# Patient Record
Sex: Male | Born: 1960
Health system: Southern US, Community
[De-identification: ages and names within clinical notes are randomized; demographics above are authoritative.]

## PROBLEM LIST (undated history)

## (undated) DIAGNOSIS — S066X9A Traumatic subarachnoid hemorrhage with loss of consciousness of unspecified duration, initial encounter: Secondary | ICD-10-CM

## (undated) DIAGNOSIS — E785 Hyperlipidemia, unspecified: Secondary | ICD-10-CM

## (undated) DIAGNOSIS — R4020 Unspecified coma: Secondary | ICD-10-CM

## (undated) DIAGNOSIS — Z789 Other specified health status: Secondary | ICD-10-CM

## (undated) DIAGNOSIS — K828 Other specified diseases of gallbladder: Secondary | ICD-10-CM

## (undated) DIAGNOSIS — I609 Nontraumatic subarachnoid hemorrhage, unspecified: Secondary | ICD-10-CM

## (undated) HISTORY — DX: Other specified diseases of gallbladder: K82.8

## (undated) HISTORY — DX: Hyperlipidemia, unspecified: E78.5

## (undated) HISTORY — PX: CARPAL TUNNEL RELEASE: SHX101

---

## 1981-01-13 HISTORY — PX: CHEST SURGERY: SHX595

## 1990-01-13 HISTORY — PX: VARICOCELECTOMY: SHX1084

## 1997-01-13 HISTORY — PX: VASECTOMY: SHX75

## 1998-12-04 ENCOUNTER — Ambulatory Visit (HOSPITAL_COMMUNITY): Admission: RE | Admit: 1998-12-04 | Discharge: 1998-12-04 | Payer: Self-pay | Admitting: Neurosurgery

## 1998-12-04 ENCOUNTER — Encounter: Payer: Self-pay | Admitting: Neurosurgery

## 2000-01-03 ENCOUNTER — Encounter: Payer: Self-pay | Admitting: Emergency Medicine

## 2000-01-03 ENCOUNTER — Emergency Department (HOSPITAL_COMMUNITY): Admission: EM | Admit: 2000-01-03 | Discharge: 2000-01-03 | Payer: Self-pay | Admitting: Emergency Medicine

## 2003-03-21 ENCOUNTER — Emergency Department (HOSPITAL_COMMUNITY): Admission: EM | Admit: 2003-03-21 | Discharge: 2003-03-21 | Payer: Self-pay | Admitting: Emergency Medicine

## 2004-01-14 HISTORY — PX: KNEE CARTILAGE SURGERY: SHX688

## 2004-06-20 ENCOUNTER — Encounter: Admission: RE | Admit: 2004-06-20 | Discharge: 2004-08-05 | Payer: Self-pay | Admitting: Orthopedic Surgery

## 2007-01-14 HISTORY — PX: ULNAR TUNNEL RELEASE: SHX820

## 2007-02-23 ENCOUNTER — Ambulatory Visit (HOSPITAL_BASED_OUTPATIENT_CLINIC_OR_DEPARTMENT_OTHER): Admission: RE | Admit: 2007-02-23 | Discharge: 2007-02-23 | Payer: Self-pay | Admitting: Orthopedic Surgery

## 2007-03-09 ENCOUNTER — Ambulatory Visit (HOSPITAL_BASED_OUTPATIENT_CLINIC_OR_DEPARTMENT_OTHER): Admission: RE | Admit: 2007-03-09 | Discharge: 2007-03-09 | Payer: Self-pay | Admitting: Orthopedic Surgery

## 2010-02-03 ENCOUNTER — Encounter: Payer: Self-pay | Admitting: Orthopedic Surgery

## 2010-03-30 DIAGNOSIS — D229 Melanocytic nevi, unspecified: Secondary | ICD-10-CM | POA: Insufficient documentation

## 2010-05-28 NOTE — Op Note (Signed)
Jacob Braun, Jacob Braun              ACCOUNT NO.:  [id number]   MEDICAL RECORD NO.:  [id number]          PATIENT TYPE:  AMB   LOCATION:  DSC                          FACILITY:  [facility]   PHYSICIAN:  Katy Fitch. Sypher, M.D. DATE OF BIRTH:  Jun 16, 1960   DATE OF PROCEDURE:  02/23/2007  DATE OF DISCHARGE:                               OPERATIVE REPORT   PREOPERATIVE DIAGNOSIS:  1. 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  2. Chronic entrapment neuropathy left median nerve at carpal tunnel.   POSTOPERATIVE DIAGNOSIS:  1. 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  2. Chronic entrapment neuropathy left median nerve at carpal tunnel.   OPERATION:  1. Decompression of left ulnar nerve at cubital tunnel.  2. Decompression of left carpal tunnel at wrist by release of      transverse carpal ligament.   SURGEON:  Katy Fitch. Sypher, M.D.   ASSISTANT:  Marveen Reeks. Dasnoit, P.A.-C.   ANESTHESIA:  General by LMA.   SUPERVISING ANESTHESIOLOGIST:  Zenon Mayo, M.D.   INDICATIONS:  Jacob Braun is a 50 year old gentleman referred through  the courtesy of Dr. Lunette Stands for evaluation and management of  bilateral hand discomfort and numbness.  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  He had had clinical examination and  electrodiagnostic studies by Dr. Charlett Blake which revealed evidence of  bilateral carpal tunnel syndrome and left ulnar neuropathy at the elbow.  Due to a failure to respond to nonoperative measures, he is now brought  to the operating room for release of his left ulnar nerve at the elbow  and release of his left median nerve at the wrist. After informed  consent, he is brought to the operating room at this time.   PROCEDURE:  Jacob Braun is brought to the operating room and placed  in the supine position on the operating table.  Following an anesthesia  consult in the holding area by Dr. Sampson Goon,  general anesthesia by LMA  technique was recommended and accepted.  Mr. Meidinger left arm was  prepped with Betadine soap solution and sterilely draped.  A pneumatic  tourniquet was applied to the proximal brachium.  Following  exsanguination of the left arm with Esmarch bandage, an arterial  tourniquet was inflated to 220 mmHg.  The procedure commenced with a  short incision in the palm in line of the ring finger.  The subcutaneous  tissues were carefully divided revealing the palmar fascia.  This was  split longitudinally to reveal the common sensory branch of the median  nerve.   The common sensory branches were followed to the median nerve proper.  This was gently isolated from the median nerve deep to the transverse  carpal ligament.  An anatomic variant was identified which included a  palmaris longus that transited the carpal canal deep to the transverse  carpal ligament rather than superficial to the ligament.  The ulnar  aspect of the transverse carpal ligament was completely released  extending into the distal forearm.  A Penfield 4 elevator was used to  separate the palmaris longus from the median nerve proper and the ulnar  bursa.  This widely opened the carpal canal.  No mass or other  predicaments were noted.  The wound was then repaired with intradermal 3-  0 Prolene suture.  Steri-Strips were applied.   Attention was then directed to the medial left elbow.  A 3 cm incision  was fashioned posterior to the medial epicondyle.  The subcutaneous  tissues were carefully divided taking care to identify the ulnar nerve  by palpation.  The arcuate ligament was markedly thickened.  There were  two leashes of blood vessels and a small cutaneous nerve that were  clearly causing compression at the entrance to the cubital tunnel.  These were gently dissected.  The nerve was initially preserved.  However, due to some compression of the ulnar nerve proper with elbow  flexion beyond 90  degrees, I ultimately electrocauterized and sacrificed  the nerve.   The fascia at the head of the flexor carpi ulnaris was gently released  with scissors dissection over a distance of 6 cm followed by teasing of  the muscle fibers and release of all fascial bands deep to the flexor  carpi ulnaris origin.  The proximal exposure of the nerve revealed a  thickened ligament of Struthers.  This was meticulously identified,  dissected away from the nerve, and released.  There were large caliber  veins on the posterior aspect of the nerve which were probably  responsible for some compression with vascular congestion.  The fascia  was generously released overlying the ulnar nerve and the accompanying  veins.  The ulnar nerve was then examined with the elbow ranged from 0  to 140 degrees of flexion.  There was no tendency towards subluxation  from the cubital tunnel.  The mesoneurium was left intact on the  posterior and radial aspect of the nerve.  Bleeding points were  electrocauterized with bipolar current followed by repair of the skin  with subdermal sutures of 4-0 Vicryl and intradermal 3-0 Prolene with  Steri-Strips.   The hand wound was dressed with Steri-Strips followed by sterile gauze  and sterile Webril.  A volar plaster splint was applied to the wrist  maintaining the wrist in 10 degrees of dorsiflexion.  At the elbow, the  wound was dressed with a Steri-Strip followed by sterile gauze and a  Tegaderm dressing followed by a compressive Ace wrap.  Both wounds were  infiltrated with 2% lidocaine for postoperative analgesia prior to  dressing application.      Katy Fitch Sypher, M.D.  Electronically Signed     RVS/MEDQ  D:  02/23/2007  T:  02/24/2007  Job:  [id number]   cc:   Lunette Stands, M.D.

## 2010-05-28 NOTE — Op Note (Signed)
Jacob Braun, Jacob Braun              ACCOUNT NO.:  [id number]   MEDICAL RECORD NO.:  [id number]          PATIENT TYPE:  AMB   LOCATION:  DSC                          FACILITY:  [facility]   PHYSICIAN:  Katy Fitch. Sypher, M.D. DATE OF BIRTH:  [birth year]   DATE OF PROCEDURE:  03/09/2007  DATE OF DISCHARGE:                               OPERATIVE REPORT   PREOPERATIVE DIAGNOSIS:  Chronic right median nerve entrapped neuropathy  at wrist level.   POSTOPERATIVE DIAGNOSIS:  Chronic right median nerve entrapped  neuropathy at wrist level.   OPERATION:  Release of right transverse carpal ligament.   OPERATIONS:  Katy Fitch. Sypher, M.D.   ASSISTANT:  Marveen Reeks. Dasnoit, P.A.-C.   ANESTHESIA:  General by LMA.   SUPERVISING ANESTHESIOLOGIST:  Dr. 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  He is status post decompression of his  left median nerve at the wrist level, and his left ulnar nerve at the  elbow.  He is 14 days postop.  He returns, at this time, anticipating  release of his right transverse carpal ligament.  Preoperatively  questions were invited and answered in detail regarding the anticipated  procedure.   DESCRIPTION OF PROCEDURE:  Jacob Braun is brought to the operating  room and placed in the supine position on the operating table.  Following the induction of general anesthesia by LMA technique, the  right arm was prepped with Betadine soap and solution and sterilely  draped.  A pneumatic tourniquet was applied proximal to the right  brachium.  Following exsanguination of right arm with an Esmarch  bandage, the arterial tourniquet was inflated to 220 mmHg.   Procedure commenced with a short incision in line of the ring finger and  the palm.  Subcutaneous tissue was carefully divided along the palmar  fascia.  This was split longitudinally to the comprehensive branch of  the median nerve.  These were  followed back to the transverse carpal  ligament which was gently isolated from the median nerve.  The ligament  was then released along its ulnar border extending into the distal  forearm.  This widened the carpal canal.   No mass or other predicaments were noted.  There was quite a bit of  adipose tissue obscuring the view of the proximal median nerve in the  forearm.  The fascia was released and confirmed by use of a [employer]  retractor to visualize the distal forearm and ulnar bursa.  The wound  was then repaired with intradermal 3-0 Prolene.  A compressive dressing  was applied, and a volar plaster splint in 5 degrees of dorsiflexion.      Katy Fitch Sypher, M.D.  Electronically Signed     RVS/MEDQ  D:  03/09/2007  T:  03/09/2007  Job:  [id number]

## 2010-10-04 LAB — POCT HEMOGLOBIN-HEMACUE
Hemoglobin: 11.4 — ABNORMAL LOW
Hemoglobin: 15.3

## 2010-10-28 ENCOUNTER — Ambulatory Visit (HOSPITAL_COMMUNITY)
Admission: RE | Admit: 2010-10-28 | Discharge: 2010-10-28 | Disposition: A | Discharge status: Home / Self Care | Payer: 59 | Source: Ambulatory Visit | Attending: Family Medicine | Admitting: Family Medicine

## 2010-10-28 ENCOUNTER — Other Ambulatory Visit: Payer: Self-pay | Admitting: Family Medicine

## 2010-10-28 DIAGNOSIS — R142 Eructation: Secondary | ICD-10-CM

## 2010-10-28 DIAGNOSIS — K824 Cholesterolosis of gallbladder: Secondary | ICD-10-CM | POA: Insufficient documentation

## 2010-10-28 DIAGNOSIS — R109 Unspecified abdominal pain: Secondary | ICD-10-CM | POA: Insufficient documentation

## 2010-12-18 ENCOUNTER — Encounter (INDEPENDENT_AMBULATORY_CARE_PROVIDER_SITE_OTHER): Payer: Self-pay | Admitting: General Surgery

## 2010-12-18 ENCOUNTER — Ambulatory Visit (INDEPENDENT_AMBULATORY_CARE_PROVIDER_SITE_OTHER): Payer: 59 | Admitting: General Surgery

## 2010-12-18 VITALS — BP 118/84 | HR 64 | Temp 97.8°F | Resp 16 | Ht 70.0 in | Wt 206.0 lb

## 2010-12-18 DIAGNOSIS — K824 Cholesterolosis of gallbladder: Secondary | ICD-10-CM

## 2010-12-18 NOTE — Progress Notes (Signed)
Patient ID: Jacob Braun, male   DOB: 06-16-1960, 50 y.o.   MRN: [id number]  Chief Complaint  Patient presents with  . New Evaluation    eval of GB with polyp     HPI Jacob Braun is a 50 y.o. male.  Referred by Dr. Leodis Sias HPI This is a 50 year old male who presents after having 5-6 episodes of what he describes as chest pain radiating straight through to his back. He clearly pointed his sternum when he is describing this. This is occurred 5-6 times with the longest being about 10 minutes. He doesn't really relate this to any aggravating factors and cannot really state that was relieved by anything in particular. He had an EKG as well as evaluation by his primary care physician and this does not appear to be cardiac in nature. He's had no pain since then. He is eating well with no nausea vomiting or no other symptoms. He did undergo an ultrasound that showed a single 3 mm gallbladder polyp. He has no gallstones, wall thickening or pericholecystic fluid. His common bile duct is normal. He comes in today to discuss what to do if this polyp.  Past Medical History  Diagnosis Date  . Hemorrhoids     Past Surgical History  Procedure Date  . Carpal tunnel release 2008 & 2009  . Ulnar tunnel release 2009    Left  . Varicocelectomy 1992  . Vasectomy 1999  . Knee cartilage surgery 2006    Right  . Chest surgery 1983    exploratory surgery     History reviewed. No pertinent family history.  Social History History  Substance Use Topics  . Smoking status: Former Smoker    Quit date: 04/07/1997  . Smokeless tobacco: Never Used  . Alcohol Use: No    Allergies  Allergen Reactions  . Oxycodone Rash    Current Outpatient Prescriptions  Medication Sig Dispense Refill  . CELEBREX 200 MG capsule       . cholestyramine (QUESTRAN) 4 G packet         Review of Systems Review of Systems  Constitutional: Negative for fever, chills and unexpected weight change.  HENT:  Negative for hearing loss, congestion, sore throat, trouble swallowing and voice change.   Eyes: Negative for visual disturbance.  Respiratory: Negative for cough and wheezing.   Cardiovascular: Positive for chest pain. Negative for palpitations and leg swelling.  Gastrointestinal: Negative for nausea, vomiting, abdominal pain, diarrhea, constipation, blood in stool, abdominal distention, anal bleeding and rectal pain.  Genitourinary: Negative for hematuria and difficulty urinating.  Musculoskeletal: Negative for arthralgias.  Skin: Negative for rash and wound.  Neurological: Negative for seizures, syncope, weakness and headaches.  Hematological: Negative for adenopathy. Does not bruise/bleed easily.  Psychiatric/Behavioral: Negative for confusion.    Blood pressure 118/84, pulse 64, temperature 97.8 F (36.6 C), temperature source Temporal, resp. rate 16, height 5\' 10"  (1.778 m), weight 206 lb (93.441 kg).  Physical Exam Physical Exam  Constitutional: He appears well-developed and well-nourished.  Eyes: No scleral icterus.  Neck: Neck supple.  Abdominal: Soft. Bowel sounds are normal. He exhibits no distension and no mass. There is no tenderness. There is negative Murphy's sign.  Lymphadenopathy:    He has no cervical adenopathy.    Data Reviewed RUQ ultrasound reviewed  Assessment    3 mm gallbladder polyp    Plan    He doesn't really have symptoms are referable to his gallbladder. I think his symptoms are  more than likely are related. I told him for 3 mm polyp which is followup in one year with an ultrasound. I will forward this to Dr. Modesto Charon so he can follow up with an ultrasound in one year. This polyp does not change isn't really need anything further.       Pooja Camuso 12/18/2010, 9:48 AM

## 2011-08-22 ENCOUNTER — Encounter: Payer: Self-pay | Admitting: Internal Medicine

## 2011-09-03 ENCOUNTER — Encounter (INDEPENDENT_AMBULATORY_CARE_PROVIDER_SITE_OTHER): Payer: Self-pay | Admitting: *Deleted

## 2011-09-04 ENCOUNTER — Other Ambulatory Visit: Payer: Self-pay | Admitting: Family Medicine

## 2011-09-04 DIAGNOSIS — K824 Cholesterolosis of gallbladder: Secondary | ICD-10-CM

## 2011-09-08 ENCOUNTER — Encounter (INDEPENDENT_AMBULATORY_CARE_PROVIDER_SITE_OTHER): Payer: Self-pay | Admitting: *Deleted

## 2011-09-08 ENCOUNTER — Telehealth (INDEPENDENT_AMBULATORY_CARE_PROVIDER_SITE_OTHER): Payer: Self-pay | Admitting: *Deleted

## 2011-09-08 ENCOUNTER — Other Ambulatory Visit (INDEPENDENT_AMBULATORY_CARE_PROVIDER_SITE_OTHER): Payer: Self-pay | Admitting: *Deleted

## 2011-09-08 DIAGNOSIS — Z1211 Encounter for screening for malignant neoplasm of colon: Secondary | ICD-10-CM

## 2011-09-08 MED ORDER — PEG-KCL-NACL-NASULF-NA ASC-C 100 G PO SOLR: 1.0000 | Freq: Once | ORAL | Status: DC

## 2011-09-08 NOTE — Telephone Encounter (Signed)
Patient needs movi prep 

## 2011-10-02 ENCOUNTER — Encounter: Payer: 59 | Admitting: Internal Medicine

## 2011-10-02 ENCOUNTER — Telehealth (INDEPENDENT_AMBULATORY_CARE_PROVIDER_SITE_OTHER): Payer: Self-pay | Admitting: *Deleted

## 2011-10-02 NOTE — Telephone Encounter (Signed)
PCP/Requesting MD: Modesto Charon -- [facility]  Name & DOB: Jacob Braun [birth year]   Procedure: tcs  Reason/Indication:  screening  Has patient had this procedure before?  no  If so, when, by whom and where?    Is there a family history of colon cancer?  no  Who?  What age when diagnosed?    Is patient diabetic?   no      Does patient have prosthetic heart valve?  no  Do you have a pacemaker?  no  Has patient had joint replacement within last 12 months?  no  Is patient on Coumadin, Plavix and/or Aspirin? no  Medications: prilosec daily  Allergies: nkda  Medication Adjustment: none  Procedure date & time: 10/30/11 at 1030

## 2011-10-07 NOTE — Telephone Encounter (Signed)
agree

## 2011-10-21 ENCOUNTER — Other Ambulatory Visit (HOSPITAL_COMMUNITY): Payer: 59

## 2011-10-22 ENCOUNTER — Encounter (HOSPITAL_COMMUNITY): Payer: Self-pay

## 2011-10-27 ENCOUNTER — Other Ambulatory Visit (HOSPITAL_COMMUNITY): Payer: 59

## 2011-10-29 MED ORDER — SODIUM CHLORIDE 0.45 % IV SOLN
INTRAVENOUS | Status: DC
  Administered 2011-10-30: 09:00:00 via INTRAVENOUS

## 2011-10-30 ENCOUNTER — Ambulatory Visit (HOSPITAL_COMMUNITY)
Admission: RE | Admit: 2011-10-30 | Discharge: 2011-10-30 | Disposition: A | Discharge status: Home / Self Care | Payer: 59 | Source: Ambulatory Visit | Attending: Internal Medicine | Admitting: Internal Medicine

## 2011-10-30 ENCOUNTER — Encounter (HOSPITAL_COMMUNITY): Payer: Self-pay | Admitting: *Deleted

## 2011-10-30 ENCOUNTER — Ambulatory Visit (HOSPITAL_COMMUNITY)
Admission: RE | Admit: 2011-10-30 | Discharge: 2011-10-30 | Disposition: A | Discharge status: Home / Self Care | Payer: 59 | Source: Ambulatory Visit | Attending: Family Medicine | Admitting: Family Medicine

## 2011-10-30 ENCOUNTER — Encounter (HOSPITAL_COMMUNITY)
Admission: RE | Disposition: A | Discharge status: Home / Self Care | Payer: Self-pay | Source: Ambulatory Visit | Attending: Internal Medicine

## 2011-10-30 ENCOUNTER — Other Ambulatory Visit: Payer: Self-pay | Admitting: Family Medicine

## 2011-10-30 DIAGNOSIS — K644 Residual hemorrhoidal skin tags: Secondary | ICD-10-CM

## 2011-10-30 DIAGNOSIS — K824 Cholesterolosis of gallbladder: Secondary | ICD-10-CM | POA: Insufficient documentation

## 2011-10-30 DIAGNOSIS — Z1211 Encounter for screening for malignant neoplasm of colon: Secondary | ICD-10-CM | POA: Insufficient documentation

## 2011-10-30 HISTORY — PX: COLONOSCOPY: SHX5424

## 2011-10-30 SURGERY — COLONOSCOPY
Anesthesia: Moderate Sedation

## 2011-10-30 MED ORDER — MIDAZOLAM HCL 5 MG/5ML IJ SOLN
INTRAMUSCULAR | Status: DC | PRN
  Administered 2011-10-30: 2 mg via INTRAVENOUS
  Administered 2011-10-30: 1 mg via INTRAVENOUS
  Administered 2011-10-30: 2 mg via INTRAVENOUS

## 2011-10-30 MED ORDER — MEPERIDINE HCL 50 MG/ML IJ SOLN
INTRAMUSCULAR | Status: AC
  Filled 2011-10-30: qty 1

## 2011-10-30 MED ORDER — STERILE WATER FOR IRRIGATION IR SOLN
Status: DC | PRN
  Administered 2011-10-30: 10:00:00

## 2011-10-30 MED ORDER — MIDAZOLAM HCL 5 MG/5ML IJ SOLN
INTRAMUSCULAR | Status: AC
  Filled 2011-10-30: qty 10

## 2011-10-30 MED ORDER — MEPERIDINE HCL 50 MG/ML IJ SOLN
INTRAMUSCULAR | Status: DC | PRN
  Administered 2011-10-30 (×2): 25 mg via INTRAVENOUS

## 2011-10-30 NOTE — H&P (Signed)
Jacob Braun is an 51 y.o. male.   Chief Complaint: Patient sent for colonoscopy. HPI: Patient is 51 year old Caucasian male who is in for screening colonoscopy. He denies abdominal pain change in his bowel habits or rectal bleeding. This is patient's first exam. Family history is negative for colorectal carcinoma polyps.  Past Medical History  Diagnosis Date  . Hemorrhoids     Past Surgical History  Procedure Date  . Carpal tunnel release 2008 & 2009  . Ulnar tunnel release 2009    Left  . Varicocelectomy 1992  . Vasectomy 1999  . Knee cartilage surgery 2006    Right  . Chest surgery 1983    exploratory surgery     No family history on file. Social History:  reports that he quit smoking about 14 years ago. He has never used smokeless tobacco. He reports that he does not drink alcohol or use illicit drugs.  Allergies:  Allergies  Allergen Reactions  . Oxycodone Rash    Medications Prior to Admission  Medication Sig Dispense Refill  . ibuprofen (ADVIL,MOTRIN) 200 MG tablet Take 400 mg by mouth daily. For pain      . omeprazole (PRILOSEC) 20 MG capsule Take 40 mg by mouth daily.      . peg 3350 powder (MOVIPREP) 100 G SOLR Take 1 kit (100 g total) by mouth once.  1 kit  0    No results found for this or any previous visit (from the past 48 hour(s)). US Abdomen Limited Ruq  10/30/2011  *RADIOLOGY REPORT*  Clinical Data: Gallbladder polyp.  LIMITED ABDOMINAL ULTRASOUND  Comparison:  10/28/2010.  Findings:  Gallbladder:  Nonshadowing 3.5 mm rounded echogenic structure non dependent portion of the gallbladder has increased slightly in size measuring 3.5 mm versus prior 2.7 mm.  Gallbladder otherwise unremarkable.  Common bile duct:  2.8 mm.  Elongated right lobe liver without focal hepatic lesion.  IMPRESSION: Minimal increase in size of gallbladder polypoid lesion as discussed above.   Original Report Authenticated By: Fuller Canada, M.D.     ROS  Blood pressure 124/85,  pulse 66, temperature 97.6 F (36.4 C), temperature source Oral, resp. rate 16, height 5\' 10"  (1.778 m), weight 198 lb (89.812 kg), SpO2 99.00%. Physical Exam  Constitutional: He appears well-developed and well-nourished.  HENT:  Mouth/Throat: Oropharynx is clear and moist. No oropharyngeal exudate.  Eyes: Conjunctivae normal are normal. No scleral icterus.  Neck: No thyromegaly present.  Cardiovascular: Normal rate, regular rhythm and normal heart sounds.   No murmur heard. Respiratory: Effort normal and breath sounds normal.  GI: Soft. He exhibits no distension. There is no tenderness.  Musculoskeletal: He exhibits no edema.  Lymphadenopathy:    He has no cervical adenopathy.  Neurological: He is alert.  Skin: Skin is warm and dry.     Assessment/Plan Average risk screening colonoscopy.  REHMAN,NAJEEB U 10/30/2011, 10:22 AM

## 2011-10-30 NOTE — Op Note (Signed)
COLONOSCOPY PROCEDURE REPORT  PATIENT:  Jacob Braun  MR#:  [id number] Birthdate:  1961/01/08, 51 y.o., male Endoscopist:  Dr. Malissa Hippo, MD Referred By:  Dr. Redmond Baseman, MD Procedure Date: 10/30/2011  Procedure:   Colonoscopy  Indications:  Patient is 51 year old Caucasian male undergoing average risk screening colonoscopy.  Informed Consent:  The procedure and risks were reviewed with the patient and informed consent was obtained.  Medications:  Demerol 50 mg IV Versed 5 mg IV  Description of procedure:  After a digital rectal exam was performed, that colonoscope was advanced from the anus through the rectum and colon to the area of the cecum, ileocecal valve and appendiceal orifice. The cecum was deeply intubated. These structures were well-seen and photographed for the record. From the level of the cecum and ileocecal valve, the scope was slowly and cautiously withdrawn. The mucosal surfaces were carefully surveyed utilizing scope tip to flexion to facilitate fold flattening as needed. The scope was pulled down into the rectum where a thorough exam including retroflexion was performed.  Findings:   Prep excellent. Normal mucosa throughout. Normal rectal mucosa. Small hemorrhoids below the dentate line.  Therapeutic/Diagnostic Maneuvers Performed:  None  Complications:  None  Cecal Withdrawal Time:  9 minutes  Impression:  Normal colonoscopy except small external hemorrhoids.  Recommendations:  Standard instructions given. Next screening exam in 10 years.  REHMAN,NAJEEB U  10/30/2011 10:48 AM  CC: Dr. Redmond Baseman, MD & Dr. Bonnetta Barry ref. provider found

## 2011-10-31 ENCOUNTER — Encounter (INDEPENDENT_AMBULATORY_CARE_PROVIDER_SITE_OTHER): Payer: Self-pay | Admitting: *Deleted

## 2011-10-31 NOTE — Telephone Encounter (Signed)
This encounter was created in error - please disregard.

## 2011-11-06 ENCOUNTER — Encounter (HOSPITAL_COMMUNITY): Payer: Self-pay | Admitting: Internal Medicine

## 2012-04-01 ENCOUNTER — Telehealth: Payer: Self-pay | Admitting: Family Medicine

## 2012-04-01 DIAGNOSIS — K824 Cholesterolosis of gallbladder: Secondary | ICD-10-CM

## 2012-04-01 NOTE — Telephone Encounter (Signed)
Left mess on pt cell phone that referral has been initaiated for April ultrasound. Advised to call if questions.

## 2012-04-01 NOTE — Telephone Encounter (Signed)
[facility] wants him to have an US of the gall bladder area. Needs an order to be done. Needs appt around 9am or 10am.

## 2012-04-01 NOTE — Telephone Encounter (Signed)
Ultrasound scheduled by me the

## 2012-04-02 ENCOUNTER — Encounter (HOSPITAL_COMMUNITY): Payer: Self-pay | Admitting: Emergency Medicine

## 2012-04-02 ENCOUNTER — Emergency Department (HOSPITAL_COMMUNITY)
Admission: EM | Admit: 2012-04-02 | Discharge: 2012-04-02 | Disposition: A | Discharge status: Home / Self Care | Payer: Worker's Compensation | Attending: Emergency Medicine | Admitting: Emergency Medicine

## 2012-04-02 DIAGNOSIS — Y9289 Other specified places as the place of occurrence of the external cause: Secondary | ICD-10-CM | POA: Insufficient documentation

## 2012-04-02 DIAGNOSIS — W268XXA Contact with other sharp object(s), not elsewhere classified, initial encounter: Secondary | ICD-10-CM | POA: Insufficient documentation

## 2012-04-02 DIAGNOSIS — Z8679 Personal history of other diseases of the circulatory system: Secondary | ICD-10-CM | POA: Insufficient documentation

## 2012-04-02 DIAGNOSIS — Y9389 Activity, other specified: Secondary | ICD-10-CM | POA: Insufficient documentation

## 2012-04-02 DIAGNOSIS — Z87891 Personal history of nicotine dependence: Secondary | ICD-10-CM | POA: Insufficient documentation

## 2012-04-02 DIAGNOSIS — S61209A Unspecified open wound of unspecified finger without damage to nail, initial encounter: Secondary | ICD-10-CM | POA: Insufficient documentation

## 2012-04-02 DIAGNOSIS — Y990 Civilian activity done for income or pay: Secondary | ICD-10-CM | POA: Insufficient documentation

## 2012-04-02 DIAGNOSIS — Z79899 Other long term (current) drug therapy: Secondary | ICD-10-CM | POA: Insufficient documentation

## 2012-04-02 NOTE — ED Notes (Signed)
PT. ACCIDENTALLY HIT HIS RIGHT 5TH FINGER WITH A KNIFE THIS EVENING AT WORK , PRESENTS WITH RIGHT DISTAL 5TH FINGER LACERATION DRESSED BY COMPANY NURSE PRIOR TO ARRIVAL .

## 2012-04-02 NOTE — ED Notes (Signed)
Pt here for avulsion of skin to pad of right pinky finger, sent here because RN from work could not get it to stop bleeding. Pt sts it is tender, pt was told by work [employer] fundraiser that it had metal fragments also. Was cut on a tobacco cutter.

## 2012-04-02 NOTE — ED Provider Notes (Signed)
History     CSN: [id number]  Arrival date & time 04/02/12  8413   First MD Initiated Contact with Patient 04/02/12 [phone]      Chief Complaint  Patient presents with  . Finger Injury    (Consider location/radiation/quality/duration/timing/severity/associated sxs/prior treatment) HPI 52 yo male presents to the ER from work with complaint of finger tip avulsion.  Tetanus is UTD.  Pt was sent from company's nurse due to retained piece of metal and persistent bleeding.  Pt c/o pain to the area.  Injury to pad of right 5th finger. Past Medical History  Diagnosis Date  . Hemorrhoids     Past Surgical History  Procedure Laterality Date  . Carpal tunnel release  2008 & 2009  . Ulnar tunnel release  2009    Left  . Varicocelectomy  1992  . Vasectomy  1999  . Knee cartilage surgery  2006    Right  . Chest surgery  1983    exploratory surgery   . Colonoscopy  10/30/2011    Procedure: COLONOSCOPY;  Surgeon: Malissa Hippo, MD;  Location: [facility];  Service: Endoscopy;  Laterality: N/A;  1030    No family history on file.  History  Substance Use Topics  . Smoking status: Former Smoker    Quit date: 04/07/1997  . Smokeless tobacco: Never Used  . Alcohol Use: No      Review of Systems  All other systems reviewed and are negative.    Allergies  Oxycodone  Home Medications   Current Outpatient Rx  Name  Route  Sig  Dispense  Refill  . fenofibrate 54 MG tablet   Oral   Take 54 mg by mouth daily.         Marland Kitchen ibuprofen (ADVIL,MOTRIN) 200 MG tablet   Oral   Take 400 mg by mouth every 6 (six) hours as needed for pain.           BP 131/86  Pulse 78  Temp(Src) 97.8 F (36.6 C) (Oral)  Resp 16  SpO2 98%  Physical Exam  Nursing note and vitals reviewed. Constitutional: He appears well-developed and well-nourished. No distress.  HENT:  Head: Normocephalic and atraumatic.  Musculoskeletal: Normal range of motion. He exhibits tenderness. He exhibits no  edema.  2 cm avulsion to pad of right 5th finger with persistent welling/bleeding.  Small fleck of fob noted in wound.  NVI  Skin: Skin is warm and dry. No rash noted. No erythema. No pallor.  Psychiatric: He has a normal mood and affect. His behavior is normal. Judgment and thought content normal.    ED Course  Procedures (including critical care time)  NERVE BLOCK Performed by: Olivia Mackie Consent: Verbal consent obtained. Required items: required blood products, implants, devices, and special equipment available Time out: Immediately prior to procedure a "time out" was called to verify the correct patient, procedure, equipment, support staff and site/side marked as required.  Indication: wound cleansing, foreign material removal Nerve block body site: right 5th finger  Preparation: Patient was prepped and draped in the usual sterile fashion. Needle gauge: 24 G Location technique: anatomical landmarks  Local anesthetic: 1% lidocaine  Anesthetic total: 2 ml  Outcome: pain improved Patient tolerance: Patient tolerated the procedure well with no immediate complications.   The wound is cleansed, debrided of foreign material as much as possible, and dressed. The patient is alerted to watch for any signs of infection (redness, pus, pain, increased swelling or fever) and call  if such occurs. Home wound care instructions are provided. Tetanus vaccination status reviewed: last tetanus booster within 10 years.  Small metal debris removed.  Wound seal used to help with hemostasis.   Labs Reviewed - No data to display No results found.   1. Avulsion, finger tip, initial encounter       MDM  52 yo male with avulsion injury.  Bleeding controlled with wound seal and pressure dressing.          Olivia Mackie, MD 04/02/12 [phone]

## 2012-04-03 ENCOUNTER — Telehealth: Payer: Self-pay | Admitting: Family Medicine

## 2012-04-03 NOTE — Telephone Encounter (Signed)
WANTS TO KNOW IF HIS ULTRASOUND HAS BEEN SCHEDULED AT [facility] YET? MARLA SAYS THAT THE APPT NEEDS TO BE MADE AT 9AM.

## 2012-04-05 NOTE — Telephone Encounter (Signed)
Patient called and given [place] appt.  05/03/12  9:00  DAB

## 2012-04-12 ENCOUNTER — Other Ambulatory Visit: Payer: Self-pay

## 2012-04-12 MED ORDER — FENOFIBRATE 54 MG PO TABS: 54.0000 mg | ORAL_TABLET | Freq: Every day | ORAL | Status: DC

## 2012-04-19 ENCOUNTER — Other Ambulatory Visit (INDEPENDENT_AMBULATORY_CARE_PROVIDER_SITE_OTHER): Payer: 59

## 2012-04-19 ENCOUNTER — Other Ambulatory Visit: Payer: Self-pay | Admitting: Family Medicine

## 2012-04-19 ENCOUNTER — Other Ambulatory Visit: Payer: Self-pay

## 2012-04-19 DIAGNOSIS — Z01818 Encounter for other preprocedural examination: Secondary | ICD-10-CM

## 2012-04-19 DIAGNOSIS — Z0184 Encounter for antibody response examination: Secondary | ICD-10-CM

## 2012-04-19 LAB — BASIC METABOLIC PANEL
BUN: 16 mg/dL (ref 6–23)
CO2: 30 mEq/L (ref 19–32)
Calcium: 9.5 mg/dL (ref 8.4–10.5)
Chloride: 105 mEq/L (ref 96–112)
Creat: 1.13 mg/dL (ref 0.50–1.35)
Glucose, Bld: 102 mg/dL — ABNORMAL HIGH (ref 70–99)
Potassium: 4.1 mEq/L (ref 3.5–5.3)
Sodium: 141 mEq/L (ref 135–145)

## 2012-04-21 NOTE — Progress Notes (Signed)
Quick Note:  Call patient. Labs normal. No change in plan. ______ 

## 2012-04-21 NOTE — Progress Notes (Signed)
Patient came in for labs only.

## 2012-04-22 ENCOUNTER — Encounter: Payer: Self-pay | Admitting: *Deleted

## 2012-04-23 ENCOUNTER — Telehealth: Payer: Self-pay

## 2012-04-23 ENCOUNTER — Other Ambulatory Visit: Payer: Self-pay | Admitting: Family Medicine

## 2012-04-23 LAB — LIPID PANEL
Cholesterol: 197 mg/dL (ref 0–200)
HDL: 44 mg/dL (ref >39)
LDL Cholesterol: 126 mg/dL — ABNORMAL HIGH (ref 0–99)
Total CHOL/HDL Ratio: 4.5 Ratio
Triglycerides: 136 mg/dL (ref <150)
VLDL: 27 mg/dL (ref 0–40)

## 2012-04-23 MED ORDER — CHOLINE FENOFIBRATE 135 MG PO CPDR: 135.0000 mg | DELAYED_RELEASE_CAPSULE | Freq: Every day | ORAL | Status: DC

## 2012-04-23 NOTE — Telephone Encounter (Signed)
Appt date and time given for u/s

## 2012-04-23 NOTE — Telephone Encounter (Signed)
Requesting lab results \\results  given  for 04-19-12

## 2012-04-23 NOTE — Progress Notes (Signed)
Quick Note:  Labs abnormal.not at goal. Increase the fenofibrate to 130 mg daily. Done in Epic ______

## 2012-05-03 ENCOUNTER — Ambulatory Visit (HOSPITAL_COMMUNITY)
Admission: RE | Admit: 2012-05-03 | Discharge: 2012-05-03 | Disposition: A | Discharge status: Home / Self Care | Payer: 59 | Source: Ambulatory Visit | Attending: Family Medicine | Admitting: Family Medicine

## 2012-05-03 DIAGNOSIS — K824 Cholesterolosis of gallbladder: Secondary | ICD-10-CM | POA: Insufficient documentation

## 2012-05-03 NOTE — Progress Notes (Signed)
Quick Note:  GallBladder polyp is stable , no change in plans. No change in Medications for now. No Change in plans and follow up. ______

## 2012-05-04 ENCOUNTER — Telehealth: Payer: Self-pay | Admitting: Family Medicine

## 2012-05-06 ENCOUNTER — Telehealth: Payer: Self-pay | Admitting: Family Medicine

## 2012-05-06 NOTE — Telephone Encounter (Signed)
Left message for pt,  Nurse can print labs at his visit.

## 2012-05-07 ENCOUNTER — Encounter: Payer: Self-pay | Admitting: Family Medicine

## 2012-05-07 ENCOUNTER — Ambulatory Visit (INDEPENDENT_AMBULATORY_CARE_PROVIDER_SITE_OTHER): Payer: 59 | Admitting: Family Medicine

## 2012-05-07 VITALS — BP 107/69 | HR 68 | Temp 97.4°F | Ht 70.0 in | Wt 196.8 lb

## 2012-05-07 DIAGNOSIS — E785 Hyperlipidemia, unspecified: Secondary | ICD-10-CM

## 2012-05-07 MED ORDER — FENOFIBRATE 54 MG PO TABS: 54.0000 mg | ORAL_TABLET | Freq: Every day | ORAL | Status: DC

## 2012-05-07 NOTE — Telephone Encounter (Signed)
Per wife has appt today at 4pm

## 2012-05-07 NOTE — Telephone Encounter (Signed)
Hold cholesterol medication for 2 weeks and OV in 2 weeks.

## 2012-05-07 NOTE — Progress Notes (Signed)
Patient ID: Jacob Braun, male   DOB: Jan 22, 1960, 52 y.o.   MRN: [id number] SUBJECTIVE: HPI: Patient is here for follow up of hyperlipidemia: denies Headache;denies Chest Pain;denies weakness;denies Shortness of Breath and orthopnea;denies Visual changes;denies palpitations;denies cough;denies pedal edema;denies symptoms of TIA or stroke;deniesClaudication symptoms. admits to Compliance with medications; Problems with medications.: with higher dose of fenofibrate Has been well. Questions in regards to goals and target. Could not tolerate the fenofibrate at 130 mg    PMH/PSH: reviewed/updated in Epic  SH/FH: reviewed/updated in Epic  Allergies: reviewed/updated in Epic  Medications: reviewed/updated in Epic  Immunizations: reviewed/updated in Epic  ROS: As above in the HPI. All other systems are stable or negative.  OBJECTIVE: APPEARANCE: white male Patient in no acute distress.The patient appeared well nourished and normally developed. Acyanotic. Waist: VITAL SIGNS:BP 107/69  Pulse 68  Temp(Src) 97.4 F (36.3 C) (Oral)  Ht 5\' 10"  (1.778 m)  Wt 196 lb 12.8 oz (89.268 kg)  BMI 28.24 kg/m2   SKIN: warm and  Dry without overt rashes, tattoos and scars  HEAD and Neck: without JVD, Head and scalp: normal Eyes:No scleral icterus. Fundi normal, eye movements normal. Ears: Auricle normal, canal normal, Tympanic membranes normal, insufflation normal. Nose: normal Throat: normal Neck & thyroid: normal  CHEST & LUNGS: Chest wall: normal Lungs: Clear  CVS: Reveals the PMI to be normally located. Regular rhythm, First and Second Heart sounds are normal,  absence of murmurs, rubs or gallops. Peripheral vasculature: Radial pulses: normal Dorsal pedis pulses: normal Posterior pulses: normal  ABDOMEN:  Appearance: normal Benign,, no organomegaly, no masses, no Abdominal Aortic enlargement. No Guarding , no rebound. No Bruits. Bowel sounds: normal  RECTAL: N/A GU:  N/A  EXTREMETIES: nonedematous. Both Femoral and Pedal pulses are normal.  MUSCULOSKELETAL:  Spine: normal Joints: intact  NEUROLOGIC: oriented to time,place and person; nonfocal. Strength is normal Sensory is normal Reflexes are normal Cranial Nerves are normal.  ASSESSMENT: HLD (hyperlipidemia) - Plan: fenofibrate 54 MG tablet  PLAN: No orders of the defined types were placed in this encounter.   No results found for this or any previous visit (from the past 24 hour(s)). Meds ordered this encounter  Medications  . DISCONTD: fenofibrate 54 MG tablet    Sig: Take 54 mg by mouth daily.  . fenofibrate 54 MG tablet    Sig: Take 1 tablet (54 mg total) by mouth daily.    Dispense:  30 tablet    Refill:  11  discussed goals and target.      Dr Woodroe Mode Recommendations  Diet and Exercise discussed with patient.  For nutrition information, I recommend books:  1).Eat to Live by Dr Monico Hoar. 2).Prevent and Reverse Heart Disease by Dr Suzzette Righter.  Exercise recommendations are:  If unable to walk, then the patient can exercise in a chair 3 times a day. By flapping arms like a bird gently and raising legs outwards to the front.  If ambulatory, the patient can go for walks for 30 minutes 3 times a week. Then increase the intensity and duration as tolerated.  Goal is to try to attain exercise frequency to 5 times a week.  If applicable: Best to perform resistance exercises (machines or weights) 2 days a week and cardio type exercises 3 days per week.  RTC 6 months.  Gian Ybarra P. Modesto Charon, M.D.

## 2012-05-07 NOTE — Patient Instructions (Addendum)
      Dr Mattison Stuckey's Recommendations  Diet and Exercise discussed with patient.  For nutrition information, I recommend books:  1).Eat to Live by Dr Joel Fuhrman. 2).Prevent and Reverse Heart Disease by Dr Caldwell Esselstyn.  Exercise recommendations are:  If unable to walk, then the patient can exercise in a chair 3 times a day. By flapping arms like a bird gently and raising legs outwards to the front.  If ambulatory, the patient can go for walks for 30 minutes 3 times a week. Then increase the intensity and duration as tolerated.  Goal is to try to attain exercise frequency to 5 times a week.  If applicable: Best to perform resistance exercises (machines or weights) 2 days a week and cardio type exercises 3 days per week.  

## 2012-11-09 ENCOUNTER — Ambulatory Visit: Payer: 59 | Admitting: Family Medicine

## 2012-11-12 ENCOUNTER — Ambulatory Visit: Payer: 59 | Admitting: Family Medicine

## 2012-11-16 ENCOUNTER — Ambulatory Visit: Payer: 59 | Admitting: Family Medicine

## 2012-11-18 ENCOUNTER — Other Ambulatory Visit: Payer: Self-pay

## 2012-12-27 ENCOUNTER — Encounter: Payer: Self-pay | Admitting: Family Medicine

## 2012-12-27 ENCOUNTER — Ambulatory Visit (INDEPENDENT_AMBULATORY_CARE_PROVIDER_SITE_OTHER): Payer: 59 | Admitting: Family Medicine

## 2012-12-27 VITALS — BP 133/85 | HR 71 | Temp 97.4°F | Ht 70.0 in | Wt 204.0 lb

## 2012-12-27 DIAGNOSIS — Z125 Encounter for screening for malignant neoplasm of prostate: Secondary | ICD-10-CM

## 2012-12-27 DIAGNOSIS — E785 Hyperlipidemia, unspecified: Secondary | ICD-10-CM | POA: Insufficient documentation

## 2012-12-27 DIAGNOSIS — K649 Unspecified hemorrhoids: Secondary | ICD-10-CM | POA: Insufficient documentation

## 2012-12-27 DIAGNOSIS — Z119 Encounter for screening for infectious and parasitic diseases, unspecified: Secondary | ICD-10-CM

## 2012-12-27 DIAGNOSIS — K828 Other specified diseases of gallbladder: Secondary | ICD-10-CM | POA: Insufficient documentation

## 2012-12-27 DIAGNOSIS — Z0000 Encounter for general adult medical examination without abnormal findings: Secondary | ICD-10-CM | POA: Insufficient documentation

## 2012-12-27 NOTE — Progress Notes (Signed)
Patient ID: Jacob Braun, male   DOB: 01/29/1960, 52 y.o.   MRN: [id number] SUBJECTIVE: CC: Chief Complaint  Patient presents with  . Annual Exam  . Knee Pain    right knee pain x 2 weeks. Has taken Celebrex which helps.     HPI:  Annual physical  Patient is here for follow up of hyperlipidemia: denies Headache;denies Chest Pain;denies weakness;denies Shortness of Breath and orthopnea;denies Visual changes;denies palpitations;denies cough;denies pedal edema;denies symptoms of TIA or stroke;deniesClaudication symptoms. admits to Compliance with medications; denies Problems with medications.  Right knee pain with occasional flare up and it is fine today. Controlled with occassional use of celebrex.  Past Medical History  Diagnosis Date  . Hemorrhoids   . Gallbladder mass   . Hyperlipidemia    Past Surgical History  Procedure Laterality Date  . Carpal tunnel release  2008 & 2009  . Ulnar tunnel release  2009    Left  . Varicocelectomy  1992  . Vasectomy  1999  . Knee cartilage surgery  2006    Right  . Chest surgery  1983    exploratory surgery   . Colonoscopy  10/30/2011    Procedure: COLONOSCOPY;  Surgeon: Malissa Hippo, MD;  Location: [facility];  Service: Endoscopy;  Laterality: N/A;  1030   History   Social History  . Marital Status: Married    Spouse Name: N/A    Number of Children: N/A  . Years of Education: N/A   Occupational History  . Not on file.   Social History Main Topics  . Smoking status: Former Smoker    Quit date: 04/07/1997  . Smokeless tobacco: Never Used  . Alcohol Use: No  . Drug Use: No  . Sexual Activity: Not on file   Other Topics Concern  . Not on file   Social History Narrative  . No narrative on file   Family History  Problem Relation Age of Onset  . Hypertension Mother   . Diabetes Mother   . COPD Mother   . Heart disease Mother   . Cancer Father    Current Outpatient Prescriptions on File Prior to Visit   Medication Sig Dispense Refill  . fenofibrate 54 MG tablet Take 1 tablet (54 mg total) by mouth daily.  30 tablet  11   No current facility-administered medications on file prior to visit.   Allergies  Allergen Reactions  . Oxycodone Rash   Immunization History  Administered Date(s) Administered  . Td 01/14/2007   Prior to Admission medications   Medication Sig Start Date End Date Taking? Authorizing Provider  fenofibrate 54 MG tablet Take 1 tablet (54 mg total) by mouth daily. 05/07/12   Ileana Ladd, MD     ROS: As above in the HPI. All other systems are stable or negative.  OBJECTIVE: APPEARANCE:  Patient in no acute distress.The patient appeared well nourished and normally developed. Acyanotic. Waist: VITAL SIGNS:BP 133/85  Pulse 71  Temp(Src) 97.4 F (36.3 C) (Oral)  Ht 5\' 10"  (1.778 m)  Wt 204 lb (92.534 kg)  BMI 29.27 kg/m2  WM looks well SKIN: warm and  Dry without overt rashes, tattoos and scars  HEAD and Neck: without JVD, Head and scalp: normal Eyes:No scleral icterus. Fundi normal, eye movements normal. Ears: Auricle normal, canal normal, Tympanic membranes normal, insufflation normal. Nose: normal Throat: normal Neck & thyroid: normal  CHEST & LUNGS: Chest wall: normal Lungs: Clear  CVS: Reveals the PMI to be  normally located. Regular rhythm, First and Second Heart sounds are normal,  absence of murmurs, rubs or gallops. Peripheral vasculature: Radial pulses: normal Dorsal pedis pulses: normal Posterior pulses: normal  ABDOMEN:  Appearance: normal Benign, no organomegaly, no masses, no Abdominal Aortic enlargement. No Guarding , no rebound. No Bruits. Bowel sounds: normal  RECTAL: Normal heme negative brown stools.prostate normal GU: Normal.  EXTREMETIES: nonedematous.  MUSCULOSKELETAL:  Spine: normal Joints: intact  NEUROLOGIC: oriented to time,place and person; nonfocal. Strength is normal Sensory is normal Reflexes are  normal Cranial Nerves are normal. Results for orders placed in visit on 04/19/12  BASIC METABOLIC PANEL      Result Value Range   Sodium 141  135 - 145 mEq/L   Potassium 4.1  3.5 - 5.3 mEq/L   Chloride 105  96 - 112 mEq/L   CO2 30  19 - 32 mEq/L   Glucose, Bld 102 (*) 70 - 99 mg/dL   BUN 16  6 - 23 mg/dL   Creat 1.61  0.96 - 0.45 mg/dL   Calcium 9.5  8.4 - 40.9 mg/dL    ASSESSMENT:  Annual physical exam  Hyperlipidemia - Plan: CMP14+EGFR, NMR, lipoprofile  Screening for prostate cancer - Plan: PSA, total and free  Screening examination for infectious disease - Plan: Hepatitis C antibody, HIV antibody  PLAN:       Dr Woodroe Mode Recommendations  For nutrition information, I recommend books:  1).Eat to Live by Dr Monico Hoar. 2).Prevent and Reverse Heart Disease by Dr Suzzette Righter. 3) Dr Katherina Right Book:  Program to Reverse Diabetes  Exercise recommendations are:  If unable to walk, then the patient can exercise in a chair 3 times a day. By flapping arms like a bird gently and raising legs outwards to the front.  If ambulatory, the patient can go for walks for 30 minutes 3 times a week. Then increase the intensity and duration as tolerated.  Goal is to try to attain exercise frequency to 5 times a week.  If applicable: Best to perform resistance exercises (machines or weights) 2 days a week and cardio type exercises 3 days per week.        HEALTH MAINTENANCE Immunizations: Tetanus-Diphtheria Booster due:2019 Pertusis Booster due:2019 Flu Shot Due: every Fall Pneumonia Vaccine: usually at 52 years of age unless there are certain risk situations. Herpes Zoster/Shingles Vaccine due: usually at 52 years of age HPV WJX:BJYN age 45 to 28 years in males and females.  Healthy Life Habits: Exercise Goal: 5-6 days/week; start gradually(ie 30 minutes/3days per week) Nutrition: Balanced healthy meals including Vegetables and Fruits. Consider  Braun the  following books: 1) Eat to Live by Dr Ottis Stain; 2) Prevent and Reverse Heart Disease by Dr Suzzette Righter.  Vitamins:multivitamin okay Aspirin:81mg  Stop Tobacco Use:n/a Seat Belt Use:+++ recommended Sunscreen Use:+++ recommended   Recommended Screening Tests: Colon Cancer Screening:up to date Blood work: today Cholesterol Screening:   today         HIV:  today                  Hepatitis C(people born 81-1965): today    Monthly Self Testicular Exam:++++  Eye Exam: every 1 to 2 years recommended Dental Health: at least every 6 months  Others:    Living Will/Healthcare Power of Attorney: should have this in order with your personal estate planning   Orders Placed This Encounter  Procedures  . CMP14+EGFR  . NMR, lipoprofile  . PSA, total  and free  . Hepatitis C antibody  . HIV antibody   Meds ordered this encounter  Medications  . DISCONTD: omeprazole (PRILOSEC) 40 MG capsule    Sig:   . celecoxib (CELEBREX) 200 MG capsule    Sig: Take 200 mg by mouth daily as needed.   Medications Discontinued During This Encounter  Medication Reason  . omeprazole (PRILOSEC) 40 MG capsule Completed Course   Return in about 6 months (around 06/27/2013) for Recheck medical problems.  Reagyn Facemire P. Modesto Charon, M.D.

## 2012-12-27 NOTE — Patient Instructions (Signed)
      Dr Woodroe Mode Recommendations  For nutrition information, I recommend books:  1).Eat to Live by Dr Monico Hoar. 2).Prevent and Reverse Heart Disease by Dr Suzzette Righter. 3) Dr Katherina Right Book:  Program to Reverse Diabetes  Exercise recommendations are:  If unable to walk, then the patient can exercise in a chair 3 times a day. By flapping arms like a bird gently and raising legs outwards to the front.  If ambulatory, the patient can go for walks for 30 minutes 3 times a week. Then increase the intensity and duration as tolerated.  Goal is to try to attain exercise frequency to 5 times a week.  If applicable: Best to perform resistance exercises (machines or weights) 2 days a week and cardio type exercises 3 days per week.        HEALTH MAINTENANCE Immunizations: Tetanus-Diphtheria Booster due:2019 Pertusis Booster due:2019 Flu Shot Due: every Fall Pneumonia Vaccine: usually at 52 years of age unless there are certain risk situations. Herpes Zoster/Shingles Vaccine due: usually at 52 years of age HPV YQM:VHQI age 59 to 3 years in males and females.  Healthy Life Habits: Exercise Goal: 5-6 days/week; start gradually(ie 30 minutes/3days per week) Nutrition: Balanced healthy meals including Vegetables and Fruits. Consider  Reading the following books: 1) Eat to Live by Dr Ottis Stain; 2) Prevent and Reverse Heart Disease by Dr Suzzette Righter.  Vitamins:multivitamin okay Aspirin:81mg  Stop Tobacco Use:n/a Seat Belt Use:+++ recommended Sunscreen Use:+++ recommended   Recommended Screening Tests: Colon Cancer Screening:up to date Blood work: today Cholesterol Screening:   today         HIV:  today                  Hepatitis C(people born 34-1965): today    Monthly Self Testicular Exam:++++  Eye Exam: every 1 to 2 years recommended Dental Health: at least every 6 months  Others:    Living Will/Healthcare Power of Attorney: should have this in  order with your personal estate planning

## 2012-12-29 LAB — PSA, TOTAL AND FREE
PSA, Free Pct: 63.3 %
PSA, Free: 0.19 ng/mL
PSA: 0.3 ng/mL (ref 0.0–4.0)

## 2012-12-29 LAB — NMR, LIPOPROFILE
Cholesterol: 205 mg/dL — ABNORMAL HIGH
HDL Cholesterol by NMR: 50 mg/dL
HDL Particle Number: 35.1 umol/L
LDL Particle Number: 1597 nmol/L — ABNORMAL HIGH
LDL Size: 20.9 nm
LDLC SERPL CALC-MCNC: 125 mg/dL — ABNORMAL HIGH
LP-IR Score: 68 — ABNORMAL HIGH
Small LDL Particle Number: 627 nmol/L — ABNORMAL HIGH
Triglycerides by NMR: 152 mg/dL — ABNORMAL HIGH

## 2012-12-29 LAB — CMP14+EGFR
ALT: 20 IU/L (ref 0–44)
AST: 17 IU/L (ref 0–40)
Albumin/Globulin Ratio: 2.1 (ref 1.1–2.5)
Albumin: 4.7 g/dL (ref 3.5–5.5)
Alkaline Phosphatase: 70 IU/L (ref 39–117)
BUN/Creatinine Ratio: 10 (ref 9–20)
BUN: 11 mg/dL (ref 6–24)
CO2: 25 mmol/L (ref 18–29)
Calcium: 9.8 mg/dL (ref 8.7–10.2)
Chloride: 100 mmol/L (ref 97–108)
Creatinine, Ser: 1.08 mg/dL (ref 0.76–1.27)
GFR calc Af Amer: 91 mL/min/{1.73_m2} (ref >59)
GFR calc non Af Amer: 78 mL/min/{1.73_m2} (ref >59)
Globulin, Total: 2.2 g/dL (ref 1.5–4.5)
Glucose: 91 mg/dL (ref 65–99)
Potassium: 4 mmol/L (ref 3.5–5.2)
Sodium: 141 mmol/L (ref 134–144)
Total Bilirubin: 0.3 mg/dL (ref 0.0–1.2)
Total Protein: 6.9 g/dL (ref 6.0–8.5)

## 2012-12-29 LAB — HIV ANTIBODY (ROUTINE TESTING W REFLEX)
HIV 1/O/2 Abs-Index Value: <1
HIV-1/HIV-2 Ab: NONREACTIVE

## 2012-12-29 LAB — HEPATITIS C ANTIBODY: Hep C Virus Ab: <0.1 s/co ratio (ref 0.0–0.9)

## 2012-12-30 ENCOUNTER — Telehealth: Payer: Self-pay | Admitting: Family Medicine

## 2012-12-30 NOTE — Telephone Encounter (Signed)
Please review labs and result for callback to patient.

## 2013-01-02 ENCOUNTER — Other Ambulatory Visit: Payer: Self-pay | Admitting: Family Medicine

## 2013-01-02 DIAGNOSIS — E785 Hyperlipidemia, unspecified: Secondary | ICD-10-CM

## 2013-01-02 MED ORDER — FENOFIBRATE 160 MG PO TABS: 160.0000 mg | ORAL_TABLET | Freq: Every day | ORAL | Status: DC

## 2013-01-03 ENCOUNTER — Encounter: Payer: Self-pay | Admitting: Family Medicine

## 2013-01-04 NOTE — Telephone Encounter (Signed)
Pt wants to try taking the Fenofibrate 54mg  bid okj to try this per FPW

## 2013-01-05 ENCOUNTER — Other Ambulatory Visit: Payer: Self-pay | Admitting: Family Medicine

## 2013-01-05 DIAGNOSIS — E785 Hyperlipidemia, unspecified: Secondary | ICD-10-CM

## 2013-01-05 MED ORDER — FENOFIBRATE 54 MG PO TABS: 54.0000 mg | ORAL_TABLET | Freq: Two times a day (BID) | ORAL | Status: DC

## 2013-01-05 NOTE — Telephone Encounter (Signed)
Call patient : Prescription refilled & sent to pharmacy in [facility]. 

## 2013-01-05 NOTE — Telephone Encounter (Signed)
Spoke with wife cannot tolerate fenofibrate 160 but stated he does tolerate the 54mg  and she says if ok with you could give him 54mg  bid If ok will need new rx sent to pharmacy. thankd

## 2013-01-05 NOTE — Telephone Encounter (Signed)
Spoke with wife and informed her that dr Modesto Charon made the change on fenofibrate 54 mg bid and rx sent to pharmacy

## 2013-01-16 ENCOUNTER — Other Ambulatory Visit: Payer: Self-pay | Admitting: Family Medicine

## 2013-01-30 ENCOUNTER — Encounter (HOSPITAL_COMMUNITY): Payer: Self-pay | Admitting: Emergency Medicine

## 2013-01-30 ENCOUNTER — Emergency Department (HOSPITAL_COMMUNITY)
Admission: EM | Admit: 2013-01-30 | Discharge: 2013-01-30 | Disposition: A | Discharge status: Home / Self Care | Payer: 59 | Attending: Emergency Medicine | Admitting: Emergency Medicine

## 2013-01-30 DIAGNOSIS — L299 Pruritus, unspecified: Secondary | ICD-10-CM | POA: Insufficient documentation

## 2013-01-30 DIAGNOSIS — R21 Rash and other nonspecific skin eruption: Secondary | ICD-10-CM | POA: Insufficient documentation

## 2013-01-30 DIAGNOSIS — Z8719 Personal history of other diseases of the digestive system: Secondary | ICD-10-CM | POA: Insufficient documentation

## 2013-01-30 DIAGNOSIS — T4995XA Adverse effect of unspecified topical agent, initial encounter: Secondary | ICD-10-CM | POA: Insufficient documentation

## 2013-01-30 DIAGNOSIS — Z8679 Personal history of other diseases of the circulatory system: Secondary | ICD-10-CM | POA: Insufficient documentation

## 2013-01-30 DIAGNOSIS — T7840XA Allergy, unspecified, initial encounter: Secondary | ICD-10-CM

## 2013-01-30 DIAGNOSIS — Z87891 Personal history of nicotine dependence: Secondary | ICD-10-CM | POA: Insufficient documentation

## 2013-01-30 DIAGNOSIS — Z79899 Other long term (current) drug therapy: Secondary | ICD-10-CM | POA: Insufficient documentation

## 2013-01-30 DIAGNOSIS — E785 Hyperlipidemia, unspecified: Secondary | ICD-10-CM | POA: Insufficient documentation

## 2013-01-30 DIAGNOSIS — Z791 Long term (current) use of non-steroidal anti-inflammatories (NSAID): Secondary | ICD-10-CM | POA: Insufficient documentation

## 2013-01-30 MED ORDER — METHYLPREDNISOLONE SODIUM SUCC 125 MG IJ SOLR
125.0000 mg | Freq: Once | INTRAMUSCULAR | Status: AC
  Administered 2013-01-30: 125 mg via INTRAMUSCULAR
  Filled 2013-01-30: qty 2

## 2013-01-30 MED ORDER — PREDNISONE 20 MG PO TABS: 60.0000 mg | ORAL_TABLET | Freq: Every day | ORAL | Status: DC

## 2013-01-30 MED ORDER — FAMOTIDINE 20 MG PO TABS: 20.0000 mg | ORAL_TABLET | Freq: Two times a day (BID) | ORAL | Status: DC

## 2013-01-30 MED ORDER — HYDROXYZINE HCL 25 MG PO TABS: 25.0000 mg | ORAL_TABLET | Freq: Four times a day (QID) | ORAL | Status: DC

## 2013-01-30 MED ORDER — EPINEPHRINE 0.3 MG/0.3ML IJ SOAJ: 0.3000 mg | INTRAMUSCULAR | Status: DC | PRN

## 2013-01-30 MED ORDER — HYDROXYZINE HCL 25 MG PO TABS
25.0000 mg | ORAL_TABLET | Freq: Once | ORAL | Status: AC
  Administered 2013-01-30: 25 mg via ORAL
  Filled 2013-01-30: qty 1

## 2013-01-30 MED ORDER — FAMOTIDINE 20 MG PO TABS
20.0000 mg | ORAL_TABLET | Freq: Once | ORAL | Status: AC
  Administered 2013-01-30: 20 mg via ORAL
  Filled 2013-01-30: qty 1

## 2013-01-30 NOTE — ED Provider Notes (Addendum)
CSN: [id number]     Arrival date & time 01/30/13  0310 History   First MD Initiated Contact with Patient 01/30/13 0325     Chief Complaint  Patient presents with  . Allergic Reaction   (Consider location/radiation/quality/duration/timing/severity/associated sxs/prior Treatment) HPI History provided by patient. Itchy rash onset around 1 AM, woke him from sleep. It involves tops of his hands and tops of his feet with whelps to his back. No associated tongue or lip swelling, throat swelling, wheezes or difficulty breathing. Itching is moderate to severe. Took Benadryl at home prior to arrival without relief. He was burning brush yesterday and believes he may have been exposed to poison oak. He denies any other known allergens or exposures. No new medications, soaps or detergents. He had a similar reaction in the past, present the same way to involve the same areas. He was treated with steroids, Vistaril at that time and his symptoms resolved.  Past Medical History  Diagnosis Date  . Hemorrhoids   . Gallbladder mass   . Hyperlipidemia    Past Surgical History  Procedure Laterality Date  . Carpal tunnel release  2008 & 2009  . Ulnar tunnel release  2009    Left  . Varicocelectomy  1992  . Vasectomy  1999  . Knee cartilage surgery  2006    Right  . Chest surgery  1983    exploratory surgery   . Colonoscopy  10/30/2011    Procedure: COLONOSCOPY;  Surgeon: Rogene Houston, MD;  Location: [facility];  Service: Endoscopy;  Laterality: N/A;  1030   Family History  Problem Relation Age of Onset  . Hypertension Mother   . Diabetes Mother   . COPD Mother   . Heart disease Mother   . Cancer Father    History  Substance Use Topics  . Smoking status: Former Smoker    Quit date: 04/07/1997  . Smokeless tobacco: Never Used  . Alcohol Use: No    Review of Systems  Constitutional: Negative for fever and chills.  HENT: Negative for sneezing, trouble swallowing and voice change.    Respiratory: Negative for cough, shortness of breath and wheezing.   Cardiovascular: Negative for chest pain.  Gastrointestinal: Negative for nausea and vomiting.  Genitourinary: Negative for dysuria.  Musculoskeletal: Negative for joint swelling.  Skin: Positive for rash.  Neurological: Negative for weakness and numbness.  All other systems reviewed and are negative.    Allergies  Oxycodone  Home Medications   Current Outpatient Rx  Name  Route  Sig  Dispense  Refill  . celecoxib (CELEBREX) 200 MG capsule   Oral   Take 200 mg by mouth daily as needed.         . fenofibrate 54 MG tablet   Oral   Take 108 mg by mouth 2 (two) times daily.         Marland Kitchen omeprazole (PRILOSEC) 40 MG capsule      TAKE ONE CAPSULE BY MOUTH EVERY DAY   30 capsule   4    BP 126/81  Pulse 70  Temp(Src) 97.7 F (36.5 C) (Oral)  Resp 20  Ht 5\' 10"  (1.778 m)  Wt 200 lb (90.719 kg)  BMI 28.70 kg/m2  SpO2 100% Physical Exam  Constitutional: He is oriented to person, place, and time. He appears well-developed and well-nourished.  HENT:  Head: Normocephalic and atraumatic.  Mouth/Throat: Oropharynx is clear and moist. No oropharyngeal exudate.  Eyes: EOM are normal. Pupils are equal,  round, and reactive to light.  Neck: Neck supple.  Cardiovascular: Normal rate, regular rhythm and intact distal pulses.   Pulmonary/Chest: Effort normal and breath sounds normal. No stridor. No respiratory distress. He has no wheezes.  Musculoskeletal: Normal range of motion. He exhibits no edema.  Neurological: He is alert and oriented to person, place, and time.  Skin: Skin is warm and dry. Rash noted.  Erythematous raised patches of well blanching rash to torso and extremities. No vesicles or petechiae    ED Course  Procedures (including critical care time) Labs Review Labs Reviewed - No data to display Imaging Review No results found.  Room air pulse ox 100% is adequate  Site Medrol, Vistaril,  Pepcid provided  Patient feeling better recheck without any respiratory symptoms. Itching much improved.   Plan discharge home with prescription for prednisone, Vistaril, Pepcid. Patient was also given prescription for EpiPen given history of allergic reaction the past. I encouraged him to start a food journal and followup with his primary care physician. He agrees to strict return precautions. Stable for discharge home at this time.  MDM  Diagnosis: Allergic reaction  No anaphylaxis. No airway involvement. Improved with medications provided. Vital signs nurse's notes reviewed and considered   Teressa Lower, MD 01/30/13 0263  Teressa Lower, MD 02/15/13 2337

## 2013-01-30 NOTE — Discharge Instructions (Signed)
Allergies °Allergies may happen from anything your body is sensitive to. This may be food, medicines, pollens, chemicals, and nearly anything around you in everyday life that produces allergens. An allergen is anything that causes an allergy producing substance. Heredity is often a factor in causing these problems. This means you may have some of the same allergies as your parents. °Food allergies happen in all age groups. Food allergies are some of the most severe and life threatening. Some common food allergies are cow's milk, seafood, eggs, nuts, wheat, and soybeans. °SYMPTOMS  °· Swelling around the mouth. °· An itchy red rash or hives. °· Vomiting or diarrhea. °· Difficulty breathing. °SEVERE ALLERGIC REACTIONS ARE LIFE-THREATENING. °This reaction is called anaphylaxis. It can cause the mouth and throat to swell and cause difficulty with breathing and swallowing. In severe reactions only a trace amount of food (for example, peanut oil in a salad) may cause death within seconds. °Seasonal allergies occur in all age groups. These are seasonal because they usually occur during the same season every year. They may be a reaction to molds, grass pollens, or tree pollens. Other causes of problems are house dust mite allergens, pet dander, and mold spores. The symptoms often consist of nasal congestion, a runny itchy nose associated with sneezing, and tearing itchy eyes. There is often an associated itching of the mouth and ears. The problems happen when you come in contact with pollens and other allergens. Allergens are the particles in the air that the body reacts to with an allergic reaction. This causes you to release allergic antibodies. Through a chain of events, these eventually cause you to release histamine into the blood stream. Although it is meant to be protective to the body, it is this release that causes your discomfort. This is why you were given anti-histamines to feel better.  If you are unable to  pinpoint the offending allergen, it may be determined by skin or blood testing. Allergies cannot be cured but can be controlled with medicine. °Hay fever is a collection of all or some of the seasonal allergy problems. It may often be treated with simple over-the-counter medicine such as diphenhydramine. Take medicine as directed. Do not drink alcohol or drive while taking this medicine. Check with your caregiver or package insert for child dosages. °If these medicines are not effective, there are many new medicines your caregiver can prescribe. Stronger medicine such as nasal spray, eye drops, and corticosteroids may be used if the first things you try do not work well. Other treatments such as immunotherapy or desensitizing injections can be used if all else fails. Follow up with your caregiver if problems continue. These seasonal allergies are usually not life threatening. They are generally more of a nuisance that can often be handled using medicine. °HOME CARE INSTRUCTIONS  °· If unsure what causes a reaction, keep a diary of foods eaten and symptoms that follow. Avoid foods that cause reactions. °· If hives or rash are present: °· Take medicine as directed. °· You may use an over-the-counter antihistamine (diphenhydramine) for hives and itching as needed. °· Apply cold compresses (cloths) to the skin or take baths in cool water. Avoid hot baths or showers. Heat will make a rash and itching worse. °· If you are severely allergic: °· Following a treatment for a severe reaction, hospitalization is often required for closer follow-up. °· Wear a medic-alert bracelet or necklace stating the allergy. °· You and your family must learn how to give adrenaline or use   an anaphylaxis kit. °· If you have had a severe reaction, always carry your anaphylaxis kit or EpiPen® with you. Use this medicine as directed by your caregiver if a severe reaction is occurring. Failure to do so could have a fatal outcome. °SEEK MEDICAL  CARE IF: °· You suspect a food allergy. Symptoms generally happen within 30 minutes of eating a food. °· Your symptoms have not gone away within 2 days or are getting worse. °· You develop new symptoms. °· You want to retest yourself or your child with a food or drink you think causes an allergic reaction. Never do this if an anaphylactic reaction to that food or drink has happened before. Only do this under the care of a caregiver. °SEEK IMMEDIATE MEDICAL CARE IF:  °· You have difficulty breathing, are wheezing, or have a tight feeling in your chest or throat. °· You have a swollen mouth, or you have hives, swelling, or itching all over your body. °· You have had a severe reaction that has responded to your anaphylaxis kit or an EpiPen®. These reactions may return when the medicine has worn off. These reactions should be considered life threatening. °MAKE SURE YOU:  °· Understand these instructions. °· Will watch your condition. °· Will get help right away if you are not doing well or get worse. °Document Released: 03/25/2002 Document Revised: 04/26/2012 Document Reviewed: 08/30/2007 °[employer]® Patient Information ©2014 [employer]. ° °

## 2013-01-30 NOTE — ED Notes (Signed)
Patient c/o rash to bilateral hand, feet and back.

## 2013-03-15 ENCOUNTER — Telehealth: Payer: Self-pay | Admitting: Family Medicine

## 2013-03-17 ENCOUNTER — Encounter (HOSPITAL_COMMUNITY): Payer: Self-pay | Admitting: Emergency Medicine

## 2013-03-17 ENCOUNTER — Other Ambulatory Visit: Payer: Self-pay | Admitting: Family Medicine

## 2013-03-17 ENCOUNTER — Emergency Department (HOSPITAL_COMMUNITY)
Admission: EM | Admit: 2013-03-17 | Discharge: 2013-03-17 | Disposition: A | Discharge status: Home / Self Care | Payer: 59 | Source: Home / Self Care | Attending: Emergency Medicine | Admitting: Emergency Medicine

## 2013-03-17 DIAGNOSIS — J111 Influenza due to unidentified influenza virus with other respiratory manifestations: Secondary | ICD-10-CM

## 2013-03-17 DIAGNOSIS — R69 Illness, unspecified: Principal | ICD-10-CM

## 2013-03-17 MED ORDER — OSELTAMIVIR PHOSPHATE 75 MG PO CAPS: 75.0000 mg | ORAL_CAPSULE | Freq: Two times a day (BID) | ORAL | Status: DC

## 2013-03-17 MED ORDER — BENZONATATE 200 MG PO CAPS: 200.0000 mg | ORAL_CAPSULE | Freq: Three times a day (TID) | ORAL | Status: DC | PRN

## 2013-03-17 MED ORDER — IPRATROPIUM BROMIDE 0.06 % NA SOLN: 2.0000 | Freq: Four times a day (QID) | NASAL | Status: DC

## 2013-03-17 NOTE — ED Notes (Signed)
Jacob Braun  Seen by dr Jake Michaelis prior to this nurse

## 2013-03-17 NOTE — Telephone Encounter (Signed)
Call patient : Prescription refilled & sent to pharmacy in [facility]. 

## 2013-03-17 NOTE — ED Provider Notes (Signed)
  Chief Complaint   Chief Complaint  Patient presents with  . URI    History of Present Illness   Jacob Braun is a 53 year old male who has had a four-day history of flulike symptoms with generalized muscle aches, chills, and subjective fever, and dry cough. He's also had some aching in his chest, clear rhinorrhea, sinus pressure, and right ear congestion. He denies any sore throat. He was exposed to her daughter with influenza about 2 weeks ago when a coworker who has been sick as well. He denies any GI complaints. The patient states he had the same thing in December and January.  Review of Systems   Other than as noted above, the patient denies any of the following symptoms: Systemic:  No fevers, chills, sweats, or myalgias. Eye:  No redness or discharge. ENT:  No ear pain, headache, nasal congestion, drainage, sinus pressure, or sore throat. Neck:  No neck pain, stiffness, or swollen glands. Lungs:  No cough, sputum production, hemoptysis, wheezing, chest tightness, shortness of breath or chest pain. GI:  No abdominal pain, nausea, vomiting or diarrhea.  [place]   Past medical history, family history, social history, meds, and allergies were reviewed. He is intolerant to hydrocodone and oxycodone. His only medication is fenofibrate.  Physical exam   Vital signs:  BP 132/90  Pulse 84  Temp(Src) 98.4 F (36.9 C) (Oral)  Resp 18  SpO2 98% General:  Alert and oriented.  In no distress.  Skin warm and dry. Eye:  No conjunctival injection or drainage. Lids were normal. ENT:  TMs and canals were normal, without erythema or inflammation.  Nasal mucosa was clear and uncongested, without drainage.  Mucous membranes were moist.  Pharynx was clear with no exudate or drainage.  There were no oral ulcerations or lesions. Neck:  Supple, no adenopathy, tenderness or mass. Lungs:  No respiratory distress.  Lungs were clear to auscultation, without wheezes, rales or rhonchi.  Breath sounds  were clear and equal bilaterally.  Heart:  Regular rhythm, without gallops, murmers or rubs. Skin:  Clear, warm, and dry, without rash or lesions.   Assessment     The encounter diagnosis was Influenza-like illness.  Plan    1.  Meds:  The following meds were prescribed:   Discharge Medication List as of 03/17/2013  8:38 AM    START taking these medications   Details  benzonatate (TESSALON) 200 MG capsule Take 1 capsule (200 mg total) by mouth 3 (three) times daily as needed for cough., Starting 03/17/2013, Until Discontinued, Normal    ipratropium (ATROVENT) 0.06 % nasal spray Place 2 sprays into both nostrils 4 (four) times daily., Starting 03/17/2013, Until Discontinued, Normal    oseltamivir (TAMIFLU) 75 MG capsule Take 1 capsule (75 mg total) by mouth every 12 (twelve) hours., Starting 03/17/2013, Until Discontinued, Normal        2.  Patient Education/Counseling:  The patient was given appropriate handouts, self care instructions, and instructed in symptomatic relief.  Instructed to get extra fluids, rest, and use a cool mist vaporizer.    3.  Follow up:  The patient was told to follow up here if no better in 3 to 4 days, or sooner if becoming worse in any way, and given some red flag symptoms such as increasing fever, difficulty breathing, chest pain, or persistent vomiting which would prompt immediate return.  Follow up here as needed.      Harden Mo, MD 03/17/13 [phone]

## 2013-03-17 NOTE — Discharge Instructions (Signed)
Most upper respiratory infections are caused by viruses and do not require antibiotics.  We try to save the antibiotics for when we really need them to prevent bacteria from developing resistance to them.  Here are a few hints about things that can be done at home to help get over an upper respiratory infection quicker: ° °Get extra sleep and extra fluids.  Get 7 to 9 hours of sleep per night and 6 to 8 glasses of water a day.  Getting extra sleep keeps the immune system from getting run down.  Most people with an upper respiratory infection are a little dehydrated.  The extra fluids also keep the secretions liquified and easier to deal with.  Also, get extra vitamin C.  4000 mg per day is the recommended dose. °For the aches, headache, and fever, acetaminophen or ibuprofen are helpful.  These can be alternated every 4 hours.  People with liver disease should avoid large amounts of acetaminophen, and people with ulcer disease, gastroesophageal reflux, gastritis, congestive heart failure, chronic kidney disease, coronary artery disease and the elderly should avoid ibuprofen. °For nasal congestion try Mucinex-D, or if you're having lots of sneezing or clear nasal drainage use Zyrtec-D. People with high blood pressure can take these if their blood pressure is controlled, if not, it's best to avoid the forms with a "D" (decongestants).  You can use the plain Mucinex, Allegra, Claritin, or Zyrtec even if your blood pressure is not controlled.   °A Saline nasal spray such as Ocean Spray can also help.  You can add a decongestant sprays such as Afrin, but you should not use the decongestant sprays for more than 3 or 4 days since they can be habituating.  Breathe Rite nasal strips can also offer a non-drug alternative treatment to nasal congestion, especially at night. °For people with symptoms of sinusitis, sleeping with your head elevated can be helpful.  For sinus pain, moist, hot compresses to the face may provide some  relief.  Many people find that inhaling steam as in a shower or from a pot of steaming water can help. °For any viral infection, zinc containing lozenges such as Cold-Eze or Zicam are helpful.  Zinc helps to fight viral infection.  Hot salt water gargles (8 oz of hot water, 1/2 tsp of table salt, and a pinch of baking soda) can give relief as well as hot beverages such as hot tea.  Sucrets extra strength lozenges will help the sore throat.  °For the cough, take Delsym 2 tsp every 12 hours.  It has also been found recently that Aleve can help control a cough.  The dose is 1 to 2 tablets twice daily with food.  This can be combined with Delsym. (Note, if you are taking ibuprofen, you should not take Aleve as well--take one or the other.) °A cool mist vaporizer will help keep your mucous membranes from drying out.  ° °It's important when you have an upper respiratory infection not to pass the infection to others.  This involves being very careful about the following: ° °Frequent hand washing or use of hand sanitizer, especially after coughing, sneezing, blowing your nose or touching your face, nose or eyes. °Do not shake hands or touch anyone and try to avoid touching surfaces that other people use such as doorknobs, shopping carts, telephones and computer keyboards. °Use tissues and dispose of them properly in a garbage can or ziplock bag. °Cough into your sleeve. °Do not let others eat or   drink after you. ° °It's also important to recognize the signs of serious illness and get evaluated if they occur: °Any respiratory infection that lasts more than 7 to 10 days.  Yellow nasal drainage and sputum are not reliable indicators of a bacterial infection, but if they last for more than 1 week, see your doctor. °Fever and sore throat can indicate strep. °Fever and cough can indicate influenza or pneumonia. °Any kind of severe symptom such as difficulty breathing, intractable vomiting, or severe pain should prompt you to see  a doctor as soon as possible. ° ° °Your body's immune system is really the thing that will get rid of this infection.  Your immune system is comprised of 2 types of specialized cells called T cells and B cells.  T cells coordinate the array of cells in your body that engulf invading bacteria or viruses while B cells orchestrate the production of antibodies that neutralize infection.  Anything we do or any medications we give you, will just strengthen your immune system or help it clear up the infection quicker.  Here are a few helpful hints to improve your immune system to help overcome this illness or to prevent future infections: °· A few vitamins can improve the health of your immune system.  That's why your diet should include plenty of fruits, vegetables, fish, nuts, and whole grains. °· Vitamin A and bet-carotene can increase the cells that fight infections (T cells and B cells).  Vitamin A is abundant in dark greens and orange vegetables such as spinach, greens, sweet potatoes, and carrots. °· Vitamin B6 contributes to the maturation of white blood cells, the cells that fight disease.  Foods with vitamin B6 include cold cereal and bananas. °· Vitamin C is credited with preventing colds because it increases white blood cells and also prevents cellular damage.  Citrus fruits, peaches and green and red bell peppers are all hight in vitamin C. °· Vitamin E is an anti-oxidant that encourages the production of natural killer cells which reject foreign invaders and B cells that produce antibodies.  Foods high in vitamin E include wheat germ, nuts and seeds. °· Foods high in omega-3 fatty acids found in foods like salmon, tuna and mackerel boost your immune system and help cells to engulf and absorb germs. °· Probiotics are good bacteria that increase your T cells.  These can be found in yogurt and are available in supplements such as Culturelle or Align. °· Moderate exercise increases the strength of your immune  system and your ability to recover from illness.  I suggest 3 to 5 moderate intensity 30 minute workouts per week.   °· Sleep is another component of maintaining a strong immune system.  It enables your body to recuperate from the day's activities, stress and work.  My recommendation is to get between 7 and 9 hours of sleep per night. °· If you smoke, try to quit completely or at least cut down.  Drink alcohol only in moderation if at all.  No more than 2 drinks daily for men or 1 for women. °· Get a flu vaccine early in the fall or if you have not gotten one yet, once this illness has run its course.  If you are over 65, a smoker, or an asthmatic, get a pneumococcal vaccine. °· My final recommendation is to maintain a healthy weight.  Excess weight can impair the immune system by interfering with the way the immune system deals with invading viruses or   bacteria. ° ° ° °Influenza, Adult °Influenza ("the flu") is a viral infection of the respiratory tract. It occurs more often in winter months because people spend more time in close contact with one another. Influenza can make you feel very sick. Influenza easily spreads from person to person (contagious). °CAUSES  °Influenza is caused by a virus that infects the respiratory tract. You can catch the virus by breathing in droplets from an infected person's cough or sneeze. You can also catch the virus by touching something that was recently contaminated with the virus and then touching your mouth, nose, or eyes. °SYMPTOMS  °Symptoms typically last 4 to 10 days and may include: °· Fever. °· Chills. °· Headache, body aches, and muscle aches. °· Sore throat. °· Chest discomfort and cough. °· Poor appetite. °· Weakness or feeling tired. °· Dizziness. °· Nausea or vomiting. °DIAGNOSIS  °Diagnosis of influenza is often made based on your history and a physical exam. A nose or throat swab test can be done to confirm the diagnosis. °RISKS AND COMPLICATIONS °You may be at risk  for a more severe case of influenza if you smoke cigarettes, have diabetes, have chronic heart disease (such as heart failure) or lung disease (such as asthma), or if you have a weakened immune system. Elderly people and pregnant women are also at risk for more serious infections. The most common complication of influenza is a lung infection (pneumonia). Sometimes, this complication can require emergency medical care and may be life-threatening. °PREVENTION  °An annual influenza vaccination (flu shot) is the best way to avoid getting influenza. An annual flu shot is now routinely recommended for all adults in the [place] °TREATMENT  °In mild cases, influenza goes away on its own. Treatment is directed at relieving symptoms. For more severe cases, your caregiver may prescribe antiviral medicines to shorten the sickness. Antibiotic medicines are not effective, because the infection is caused by a virus, not by bacteria. °HOME CARE INSTRUCTIONS °· Only take over-the-counter or prescription medicines for pain, discomfort, or fever as directed by your caregiver. °· Use a cool mist humidifier to make breathing easier. °· Get plenty of rest until your temperature returns to normal. This usually takes 3 to 4 days. °· Drink enough fluids to keep your urine clear or pale yellow. °· Cover your mouth and nose when coughing or sneezing, and wash your hands well to avoid spreading the virus. °· Stay home from work or school until your fever has been gone for at least 1 full day. °SEEK MEDICAL CARE IF:  °· You have chest pain or a deep cough that worsens or produces more mucus. °· You have nausea, vomiting, or diarrhea. °SEEK IMMEDIATE MEDICAL CARE IF:  °· You have difficulty breathing, shortness of breath, or your skin or nails turn bluish. °· You have severe neck pain or stiffness. °· You have a severe headache, facial pain, or earache. °· You have a worsening or recurring fever. °· You have nausea or vomiting that cannot be  controlled. °MAKE SURE YOU: °· Understand these instructions. °· Will watch your condition. °· Will get help right away if you are not doing well or get worse. °Document Released: 12/28/1999 Document Revised: 07/01/2011 Document Reviewed: 03/31/2011 °[employer]® Patient Information ©2014 [employer]. ° °

## 2013-03-17 NOTE — Telephone Encounter (Signed)
Went to urgent care Monday night and got script fofr tamiflu.

## 2013-04-22 ENCOUNTER — Encounter: Payer: Self-pay | Admitting: *Deleted

## 2013-06-28 ENCOUNTER — Other Ambulatory Visit (HOSPITAL_COMMUNITY): Payer: Self-pay | Admitting: Family Medicine

## 2013-06-28 ENCOUNTER — Ambulatory Visit: Payer: 59 | Admitting: Family Medicine

## 2013-06-28 DIAGNOSIS — K824 Cholesterolosis of gallbladder: Secondary | ICD-10-CM

## 2013-06-30 ENCOUNTER — Ambulatory Visit (HOSPITAL_COMMUNITY): Payer: 59

## 2013-08-10 ENCOUNTER — Encounter: Payer: Self-pay | Admitting: Cardiology

## 2013-08-10 ENCOUNTER — Ambulatory Visit (INDEPENDENT_AMBULATORY_CARE_PROVIDER_SITE_OTHER): Payer: 59 | Admitting: Cardiology

## 2013-08-10 VITALS — BP 121/83 | HR 74 | Ht 70.0 in | Wt 198.0 lb

## 2013-08-10 DIAGNOSIS — Z0000 Encounter for general adult medical examination without abnormal findings: Secondary | ICD-10-CM

## 2013-08-10 DIAGNOSIS — E785 Hyperlipidemia, unspecified: Secondary | ICD-10-CM

## 2013-08-10 NOTE — Patient Instructions (Signed)
The current medical regimen is effective;  continue present plan and medications.  Follow up as needed 

## 2013-08-10 NOTE — Progress Notes (Signed)
HPI  the patient presents as a new patient for evaluation of  Dyspnea as well as dyslipidemia. He has had difficult to control lipids with particular problems with elevated triglycerides. He's been intolerant of most medications though he is currently on a low dose of fenofibrate. He does get knee pains with this.   He does get some dyspnea but he says this has to be with significant exertion. He does a lot of activities and works physically. With this he denies any cardiovascular symptoms. The patient denies any new symptoms such as chest discomfort, neck or arm discomfort. There has been no new shortness of breath, PND or orthopnea. There have been no reported palpitations, presyncope or syncope.  Allergies  Allergen Reactions  . Oxycodone Rash    Current Outpatient Prescriptions  Medication Sig Dispense Refill  . famotidine (PEPCID) 20 MG tablet Take 20 mg by mouth as needed.      . fenofibrate 54 MG tablet Take 108 mg by mouth 2 (two) times daily.      Marland Kitchen EPINEPHrine (EPIPEN) 0.3 mg/0.3 mL SOAJ injection Inject 0.3 mLs (0.3 mg total) into the muscle as needed.  1 Device  1   No current facility-administered medications for this visit.    Past Medical History  Diagnosis Date  . Hemorrhoids   . Gallbladder mass   . Hyperlipidemia     Past Surgical History  Procedure Laterality Date  . Carpal tunnel release  2008 & 2009  . Ulnar tunnel release  2009    Left  . Varicocelectomy  1992  . Vasectomy  1999  . Knee cartilage surgery  2006    Right  . Chest surgery  1983    exploratory surgery   . Colonoscopy  10/30/2011    Procedure: COLONOSCOPY;  Surgeon: Rogene Houston, MD;  Location: [facility];  Service: Endoscopy;  Laterality: N/A;  1030    Family History  Problem Relation Age of Onset  . Hypertension Mother   . Diabetes Mother   . COPD Mother   . Heart disease Mother   . Cancer Father     History   Social History  . Marital Status: Married    Spouse Name:  N/A    Number of Children: N/A  . Years of Education: N/A   Occupational History  . Not on file.   Social History Main Topics  . Smoking status: Former Smoker    Quit date: 04/07/1997  . Smokeless tobacco: Never Used  . Alcohol Use: No  . Drug Use: No  . Sexual Activity: Not on file   Other Topics Concern  . Not on file   Social History Narrative  . No narrative on file    ROS:   Positive for decreased hearing , knee swelling in the leg cramping.  Otherwise as stated in the HPI and negative for all other systems.  PHYSICAL EXAM BP 121/83  Pulse 74  Ht 5\' 10"  (1.778 m)  Wt 198 lb (89.812 kg)  BMI 28.41 kg/m2 GENERAL:  Well appearing HEENT:  Pupils equal round and reactive, fundi not visualized, oral mucosa unremarkable NECK:  No jugular venous distention, waveform within normal limits, carotid upstroke brisk and symmetric, no bruits, no thyromegaly LYMPHATICS:  No cervical, inguinal adenopathy LUNGS:  Clear to auscultation bilaterally BACK:  No CVA tenderness CHEST:  Unremarkable HEART:  PMI not displaced or sustained,S1 and S2 within normal limits, no S3, no S4, no clicks, no rubs, no murmurs  ABD:  Flat, positive bowel sounds normal in frequency in pitch, no bruits, no rebound, no guarding, no midline pulsatile mass, no hepatomegaly, no splenomegaly EXT:  2 plus pulses throughout, no edema, no cyanosis no clubbing SKIN:  No rashes no nodules NEURO:  Cranial nerves II through XII grossly intact, motor grossly intact throughout PSYCH:  Cognitively intact, oriented to person place and time   EKG:   Sinus rhythm, rate 72, axis within normal limits, intervals within normal limits, no acute ST-T wave changes.  08/10/2013  ASSESSMENT AND PLAN   HYPERLIPIDEMIA:   We had a long discussion about this. Aggressive management would be for primary risk reduction end of the day 4 benefit would be very marginal in this situation. If he could easily take the fenofibrate he could  continue this. However, I would actually suggest that he have a trial off of it to see if his knees improved. We discussed at length dietary changes instead.  RISK REDUCTION:   He has a low pretest probability of obstructive coronary disease. He works very hard and does not get chest pain. Therefore, no further cardiovascular testing would be indicated at this point.

## 2013-10-28 ENCOUNTER — Other Ambulatory Visit: Payer: Self-pay

## 2014-02-13 ENCOUNTER — Emergency Department (HOSPITAL_COMMUNITY): Payer: 59

## 2014-02-13 ENCOUNTER — Encounter (HOSPITAL_COMMUNITY): Payer: Self-pay | Admitting: *Deleted

## 2014-02-13 ENCOUNTER — Inpatient Hospital Stay (HOSPITAL_COMMUNITY)
Admission: EM | Admit: 2014-02-13 | Discharge: 2014-02-24 | DRG: 066 | Disposition: A | Discharge status: Home / Self Care | Payer: 59 | Attending: Neurosurgery | Admitting: Neurosurgery

## 2014-02-13 DIAGNOSIS — R519 Headache, unspecified: Secondary | ICD-10-CM

## 2014-02-13 DIAGNOSIS — I609 Nontraumatic subarachnoid hemorrhage, unspecified: Secondary | ICD-10-CM | POA: Diagnosis present

## 2014-02-13 DIAGNOSIS — R51 Headache: Secondary | ICD-10-CM | POA: Diagnosis present

## 2014-02-13 DIAGNOSIS — I671 Cerebral aneurysm, nonruptured: Secondary | ICD-10-CM | POA: Diagnosis present

## 2014-02-13 DIAGNOSIS — Z87891 Personal history of nicotine dependence: Secondary | ICD-10-CM | POA: Diagnosis not present

## 2014-02-13 DIAGNOSIS — E785 Hyperlipidemia, unspecified: Secondary | ICD-10-CM | POA: Diagnosis present

## 2014-02-13 HISTORY — DX: Other specified health status: Z78.9

## 2014-02-13 LAB — PROTIME-INR
INR: 0.95 (ref 0.00–1.49)
Prothrombin Time: 12.7 seconds (ref 11.6–15.2)

## 2014-02-13 LAB — COMPREHENSIVE METABOLIC PANEL
ALT: 18 U/L (ref 0–53)
AST: 28 U/L (ref 0–37)
Albumin: 4.2 g/dL (ref 3.5–5.2)
Alkaline Phosphatase: 76 U/L (ref 39–117)
Anion gap: 8 (ref 5–15)
BUN: 12 mg/dL (ref 6–23)
CO2: 24 mmol/L (ref 19–32)
Calcium: 9.1 mg/dL (ref 8.4–10.5)
Chloride: 106 mmol/L (ref 96–112)
Creatinine, Ser: 1.1 mg/dL (ref 0.50–1.35)
GFR calc Af Amer: 87 mL/min — ABNORMAL LOW (ref >90)
GFR calc non Af Amer: 75 mL/min — ABNORMAL LOW (ref >90)
Glucose, Bld: 151 mg/dL — ABNORMAL HIGH (ref 70–99)
Potassium: 4.1 mmol/L (ref 3.5–5.1)
Sodium: 138 mmol/L (ref 135–145)
Total Bilirubin: 0.5 mg/dL (ref 0.3–1.2)
Total Protein: 6.9 g/dL (ref 6.0–8.3)

## 2014-02-13 LAB — CBC
HCT: 43.6 % (ref 39.0–52.0)
Hemoglobin: 15.3 g/dL (ref 13.0–17.0)
MCH: 30.1 pg (ref 26.0–34.0)
MCHC: 35.1 g/dL (ref 30.0–36.0)
MCV: 85.8 fL (ref 78.0–100.0)
Platelets: 201 10*3/uL (ref 150–400)
RBC: 5.08 MIL/uL (ref 4.22–5.81)
RDW: 12.3 % (ref 11.5–15.5)
WBC: 11.1 10*3/uL — ABNORMAL HIGH (ref 4.0–10.5)

## 2014-02-13 MED ORDER — SODIUM CHLORIDE 0.9 % IV SOLN
INTRAVENOUS | Status: DC
  Administered 2014-02-14 – 2014-02-17 (×9): via INTRAVENOUS
  Administered 2014-02-17 – 2014-02-18 (×2): 125 mL/h via INTRAVENOUS
  Administered 2014-02-18 (×2): via INTRAVENOUS
  Administered 2014-02-19: 1000 mL via INTRAVENOUS
  Administered 2014-02-19 – 2014-02-20 (×2): via INTRAVENOUS
  Administered 2014-02-20: 1000 mL via INTRAVENOUS
  Administered 2014-02-20 – 2014-02-24 (×6): via INTRAVENOUS

## 2014-02-13 MED ORDER — PANTOPRAZOLE SODIUM 40 MG IV SOLR
40.0000 mg | Freq: Every day | INTRAVENOUS | Status: DC
  Administered 2014-02-14 (×2): 40 mg via INTRAVENOUS
  Filled 2014-02-13 (×3): qty 40

## 2014-02-13 MED ORDER — NIMODIPINE 60 MG/20ML PO SOLN
60.0000 mg | ORAL | Status: DC
  Administered 2014-02-14 – 2014-02-20 (×37): 60 mg via ORAL
  Filled 2014-02-13 (×45): qty 20

## 2014-02-13 MED ORDER — DIPHENHYDRAMINE HCL 50 MG/ML IJ SOLN
12.5000 mg | Freq: Once | INTRAMUSCULAR | Status: AC
Start: 2014-02-13 — End: 2014-02-13
  Administered 2014-02-13: 12.5 mg via INTRAVENOUS
  Filled 2014-02-13: qty 1

## 2014-02-13 MED ORDER — LABETALOL HCL 5 MG/ML IV SOLN: 10.0000 mg | INTRAVENOUS | Status: DC | PRN

## 2014-02-13 MED ORDER — PROCHLORPERAZINE EDISYLATE 5 MG/ML IJ SOLN
10.0000 mg | Freq: Once | INTRAMUSCULAR | Status: AC
  Administered 2014-02-13: 10 mg via INTRAVENOUS
  Filled 2014-02-13: qty 2

## 2014-02-13 MED ORDER — IOHEXOL 350 MG/ML SOLN
50.0000 mL | Freq: Once | INTRAVENOUS | Status: AC | PRN
  Administered 2014-02-13: 50 mL via INTRAVENOUS

## 2014-02-13 MED ORDER — OXYCODONE-ACETAMINOPHEN 5-325 MG PO TABS: 1.0000 | ORAL_TABLET | ORAL | Status: DC | PRN

## 2014-02-13 MED ORDER — STROKE: EARLY STAGES OF RECOVERY BOOK
Freq: Once | Status: AC
  Administered 2014-02-14: 01:00:00
  Filled 2014-02-13 (×2): qty 1

## 2014-02-13 MED ORDER — FENTANYL CITRATE 0.05 MG/ML IJ SOLN
INTRAMUSCULAR | Status: AC
  Filled 2014-02-13: qty 2

## 2014-02-13 MED ORDER — SENNOSIDES-DOCUSATE SODIUM 8.6-50 MG PO TABS
1.0000 | ORAL_TABLET | Freq: Two times a day (BID) | ORAL | Status: DC
  Administered 2014-02-14 – 2014-02-24 (×19): 1 via ORAL
  Filled 2014-02-13 (×23): qty 1

## 2014-02-13 MED ORDER — HYDROMORPHONE HCL 1 MG/ML IJ SOLN
1.0000 mg | Freq: Once | INTRAMUSCULAR | Status: AC
  Administered 2014-02-13: 1 mg via INTRAVENOUS
  Filled 2014-02-13: qty 1

## 2014-02-13 MED ORDER — ACETAMINOPHEN 650 MG RE SUPP: 650.0000 mg | RECTAL | Status: DC | PRN

## 2014-02-13 MED ORDER — LEVETIRACETAM IN NACL 500 MG/100ML IV SOLN
500.0000 mg | Freq: Two times a day (BID) | INTRAVENOUS | Status: DC
  Administered 2014-02-14 – 2014-02-15 (×4): 500 mg via INTRAVENOUS
  Filled 2014-02-13 (×7): qty 100

## 2014-02-13 MED ORDER — SODIUM CHLORIDE 0.9 % IV BOLUS (SEPSIS)
1000.0000 mL | Freq: Once | INTRAVENOUS | Status: AC
  Administered 2014-02-13: 1000 mL via INTRAVENOUS

## 2014-02-13 MED ORDER — ACETAMINOPHEN 325 MG PO TABS
650.0000 mg | ORAL_TABLET | ORAL | Status: DC | PRN
  Administered 2014-02-14 (×3): 650 mg via ORAL
  Filled 2014-02-13 (×4): qty 2

## 2014-02-13 MED ORDER — DEXAMETHASONE SODIUM PHOSPHATE 10 MG/ML IJ SOLN
10.0000 mg | Freq: Once | INTRAMUSCULAR | Status: AC
  Administered 2014-02-13: 10 mg via INTRAVENOUS
  Filled 2014-02-13: qty 1

## 2014-02-13 MED ORDER — HYDROMORPHONE HCL 1 MG/ML IJ SOLN
0.5000 mg | INTRAMUSCULAR | Status: DC | PRN
  Administered 2014-02-15 – 2014-02-23 (×14): 0.5 mg via INTRAVENOUS
  Filled 2014-02-13 (×15): qty 1

## 2014-02-13 MED ORDER — OXYCODONE HCL 5 MG PO TABS: 5.0000 mg | ORAL_TABLET | ORAL | Status: DC | PRN

## 2014-02-13 NOTE — ED Provider Notes (Signed)
CSN: [id number]     Arrival date & time 02/13/14  1957 History   First MD Initiated Contact with Patient 02/13/14 2019     Chief Complaint  Patient presents with  . Headache     (Consider location/radiation/quality/duration/timing/severity/associated sxs/prior Treatment) HPI 54 year old male past history is below persist ED complaining of acute onset headache that began at 6 PM tonight. Patient states pain is located in his entire head, is throbbing, is 10/10. Patient also reports having nausea and vomiting associated with this. He states this is the worst headache of his life. Patient also reports having a much milder headache about a week and half ago. Patient states he is having some tingling in his hands. Patient states he does feel little unsteady on his feet. Denies any weakness, vision changes, abdominal pain, chest pain, shortness of breath. Patient reports that his father had GBM. No family history of aneurysms. No other complaints at this time. Past Medical History  Diagnosis Date  . Hemorrhoids   . Gallbladder mass   . Hyperlipidemia    Past Surgical History  Procedure Laterality Date  . Carpal tunnel release  2008 & 2009  . Ulnar tunnel release  2009    Left  . Varicocelectomy  1992  . Vasectomy  1999  . Knee cartilage surgery  2006    Right  . Chest surgery  1983    exploratory surgery   . Colonoscopy  10/30/2011    Procedure: COLONOSCOPY;  Surgeon: Rogene Houston, MD;  Location: [facility];  Service: Endoscopy;  Laterality: N/A;  1030   Family History  Problem Relation Age of Onset  . Hypertension Mother   . Diabetes Mother   . COPD Mother   . Heart disease Mother 66    CHF  . Cancer Father    History  Substance Use Topics  . Smoking status: Former Smoker    Quit date: 04/07/1997  . Smokeless tobacco: Never Used  . Alcohol Use: No    Review of Systems  Constitutional: Negative for fever, activity change and appetite change.  HENT: Negative for  congestion, rhinorrhea and sore throat.   Eyes: Negative for visual disturbance.  Respiratory: Negative for cough and shortness of breath.   Cardiovascular: Negative for chest pain, palpitations and leg swelling.  Gastrointestinal: Positive for nausea and vomiting. Negative for abdominal pain and diarrhea.  Genitourinary: Negative for dysuria, flank pain, decreased urine volume and difficulty urinating.  Musculoskeletal: Negative for back pain and neck pain.  Skin: Negative for rash.  Neurological: Positive for numbness and headaches. Negative for dizziness, syncope, speech difficulty, weakness and light-headedness.  Psychiatric/Behavioral: Negative for confusion.      Allergies  Oxycodone  Home Medications   Prior to Admission medications   Medication Sig Start Date End Date Taking? Authorizing Provider  EPINEPHrine (EPIPEN) 0.3 mg/0.3 mL SOAJ injection Inject 0.3 mLs (0.3 mg total) into the muscle as needed. 01/30/13   Teressa Lower, MD  famotidine (PEPCID) 20 MG tablet Take 20 mg by mouth as needed. 01/30/13   Teressa Lower, MD  fenofibrate 54 MG tablet Take 108 mg by mouth 2 (two) times daily. 01/05/13   Vernie Shanks, MD   BP 157/97 mmHg  Pulse 101  Temp(Src) 97.7 F (36.5 C) (Oral)  Resp 21  SpO2 99% Physical Exam  Constitutional: He is oriented to person, place, and time. He appears well-developed and well-nourished. No distress.  HENT:  Head: Normocephalic and atraumatic.  Nose: Nose normal.  Mouth/Throat: Oropharynx is clear and moist. No oropharyngeal exudate.  Eyes: Conjunctivae and EOM are normal.  Neck: Normal range of motion. Neck supple. No JVD present.  Cardiovascular: Regular rhythm, normal heart sounds and intact distal pulses.  Tachycardia present.   Pulmonary/Chest: Effort normal and breath sounds normal. No respiratory distress.  Abdominal: Soft. He exhibits no distension. There is no tenderness. There is no rebound and no guarding.  Musculoskeletal: Normal  range of motion.  Neurological: He is alert and oriented to person, place, and time. No cranial nerve deficit. GCS eye subscore is 4. GCS verbal subscore is 5. GCS motor subscore is 6.  HDS, AAOx4. PERRL, EOMI, TML, face sym. CN 2-12 grossly intact. 5/5 sym, no drift, SILT, normal and coordination.    Skin: Skin is warm and dry. No rash noted.  Psychiatric: He has a normal mood and affect.    ED Course  Procedures (including critical care time) Labs Review Labs Reviewed  CBC - Abnormal; Notable for the following:    WBC 11.1 (*)    All other components within normal limits  COMPREHENSIVE METABOLIC PANEL - Abnormal; Notable for the following:    Glucose, Bld 151 (*)    GFR calc non Af Amer 75 (*)    GFR calc Af Amer 87 (*)    All other components within normal limits  MRSA PCR SCREENING  PROTIME-INR    Imaging Review Ct Angio Head W/cm &/or Wo Cm  02/13/2014   EXAM: CT ANGIOGRAPHY HEAD  TECHNIQUE: Multidetector CT imaging of the head was performed using the standard protocol during bolus administration of intravenous contrast. Multiplanar CT image reconstructions and MIPs were obtained to evaluate the vascular anatomy.  CONTRAST:  57mL OMNIPAQUE IOHEXOL 350 MG/ML SOLN  COMPARISON:  None.  FINDINGS: CT HEAD  Brain: Precontrast imaging of the brain demonstrates diffuse hyperdensity within the basilar cisterns, compatible with acute subarachnoid hemorrhage. No associated mass effect. No hydrocephalus.  No acute large vessel territory infarct. Gray-white matter differentiation maintained.  No extra-axial fluid collection.  No mass lesion.  No midline shift  Calvarium and skull base: Calvarium intact. No abnormality seen about the skullbase.  Paranasal sinuses: Paranasal sinuses are well pneumatized and free of fluid. No mastoid effusion.  Orbits: No acute abnormality seen about the orbits.  CTA HEAD  Anterior circulation: Visualized distal sulcal segments of the internal carotid arteries are  widely well opacified without acute abnormality. The petrous, cavernous, and supra clinoid segments of the internal carotid arteries well opacified. A1 segments mildly irregular bilaterally, which may be related to Vasa spasm given the presence of subarachnoid hemorrhage.  There is a suspected small irregular focal outpouching arising from the anterior communicating artery, suspicious for a small aneurysm. This measures approximately 3 x 3 x 2 mm, and is directed slightly superiorly and anteriorly.  Anterior communicating arteries well opacified distally.  M1 segments widely patent without occlusion or stenosis. No aneurysm or other abnormality seen at the MCA bifurcations. Distal MCA branches within normal limits.  Posterior circulation: Vertebral arteries appear to be codominant. Posterior inferior cerebellar arteries within normal limits. Vertebrobasilar junction and basilar artery are unremarkable. No basilar tip aneurysm. Superior cerebellar arteries within normal limits. Posterior cerebral arteries are unremarkable.  Venous sinuses: No acute abnormality seen within the venous sinuses.  Anatomic variants: No anatomic variant identified.  Delayed phase:No abnormal enhancement on delayed sequence.  IMPRESSION: 1. Acute subarachnoid hemorrhage within the basilar cisterns. No significant mass effect at this time. No  hydrocephalus. 2. Small 2 x 3 x 3 mm focal outpouching arising from the anterior communicating artery, suspicious for a small aneurysm. 3. No other acute abnormality identified within the intracranial circulation. Critical Value/emergent results were called by telephone at the time of interpretation on 02/13/2014 at 9:57 pm to Dr. Malachy Moan , who verbally acknowledged these results.   Electronically Signed   By: Jeannine Boga M.D.   On: 02/13/2014 22:00     EKG Interpretation   Date/Time:  Monday February 13 2014 20:29:19 EST Ventricular Rate:  95 PR Interval:  149 QRS Duration: 93 QT  Interval:  335 QTC Calculation: 421 R Axis:   76 Text Interpretation:  Sinus arrhythmia Normal ECG Confirmed by Betsey Holiday   MD, CHRISTOPHER [phone]) on 02/13/2014 9:09:44 PM      MDM   Final diagnoses:  None    Jacob Braun is a 54 y.o. male with H&P as above. Hemodynamically stable on arrival in obvious distress. Neuro exam is benign. Presentation concerning for [facility] and patient sent for scan. Patient given pain medicine and headache cocktail for headache. Pain significantly improved in ED. Neuro exam remains benign. VSS CTA shows SAH and likely small aneurysm. NSU c/s and they will admit.  Pt seen in conjunction with Dr. Georgie Chard, [facility] Resident - PGY-2      Kirstie Peri, MD 02/14/14 0002  Orpah Greek, MD 02/18/14 [phone]

## 2014-02-13 NOTE — H&P (Signed)
Jacob Braun is an 54 y.o. male.   Chief Complaint: Headache HPI: Patient is a 54 year old gentleman who was on a tractor earlier today experienced sudden onset severe headache worsening of his life and the base of his head back of his neck associated with some nausea but no vomiting. Patient sat around waiting for it to subside when it didn't contact his family has been brought to the emergency permit. Workup has included CT scan and CTA aneurysmal subarachnoid hemorrhage has been confirmed and we have been consult. She was experiencing some numbness in his fingertips when he initially sat down the stretcher but this is resolved. Patient does report some photophobia denies any double vision denies any weakness. Patient does report a headache that began him Gwynne Edinger day that was not quite as severe but didn't begin all of a sudden that he graded about a 5-6 out of 10 with a headache he is experiencing now a 10 out of 10. This may have been a sentinel hemorrhage.  Past Medical History  Diagnosis Date  . Hemorrhoids   . Gallbladder mass   . Hyperlipidemia     Past Surgical History  Procedure Laterality Date  . Carpal tunnel release  2008 & 2009  . Ulnar tunnel release  2009    Left  . Varicocelectomy  1992  . Vasectomy  1999  . Knee cartilage surgery  2006    Right  . Chest surgery  1983    exploratory surgery   . Colonoscopy  10/30/2011    Procedure: COLONOSCOPY;  Surgeon: Rogene Houston, MD;  Location: [facility];  Service: Endoscopy;  Laterality: N/A;  1030    Family History  Problem Relation Age of Onset  . Hypertension Mother   . Diabetes Mother   . COPD Mother   . Heart disease Mother 41    CHF  . Cancer Father    Social History:  reports that he quit smoking about 16 years ago. He has never used smokeless tobacco. He reports that he does not drink alcohol or use illicit drugs.  Allergies:  Allergies  Allergen Reactions  . Oxycodone Rash     (Not in a  hospital admission)  No results found for this or any previous visit (from the past 48 hour(s)). Ct Angio Head W/cm &/or Wo Cm  02/13/2014   EXAM: CT ANGIOGRAPHY HEAD  TECHNIQUE: Multidetector CT imaging of the head was performed using the standard protocol during bolus administration of intravenous contrast. Multiplanar CT image reconstructions and MIPs were obtained to evaluate the vascular anatomy.  CONTRAST:  64mL OMNIPAQUE IOHEXOL 350 MG/ML SOLN  COMPARISON:  None.  FINDINGS: CT HEAD  Brain: Precontrast imaging of the brain demonstrates diffuse hyperdensity within the basilar cisterns, compatible with acute subarachnoid hemorrhage. No associated mass effect. No hydrocephalus.  No acute large vessel territory infarct. Gray-white matter differentiation maintained.  No extra-axial fluid collection.  No mass lesion.  No midline shift  Calvarium and skull base: Calvarium intact. No abnormality seen about the skullbase.  Paranasal sinuses: Paranasal sinuses are well pneumatized and free of fluid. No mastoid effusion.  Orbits: No acute abnormality seen about the orbits.  CTA HEAD  Anterior circulation: Visualized distal sulcal segments of the internal carotid arteries are widely well opacified without acute abnormality. The petrous, cavernous, and supra clinoid segments of the internal carotid arteries well opacified. A1 segments mildly irregular bilaterally, which may be related to Vasa spasm given the presence of subarachnoid  hemorrhage.  There is a suspected small irregular focal outpouching arising from the anterior communicating artery, suspicious for a small aneurysm. This measures approximately 3 x 3 x 2 mm, and is directed slightly superiorly and anteriorly.  Anterior communicating arteries well opacified distally.  M1 segments widely patent without occlusion or stenosis. No aneurysm or other abnormality seen at the MCA bifurcations. Distal MCA branches within normal limits.  Posterior circulation:  Vertebral arteries appear to be codominant. Posterior inferior cerebellar arteries within normal limits. Vertebrobasilar junction and basilar artery are unremarkable. No basilar tip aneurysm. Superior cerebellar arteries within normal limits. Posterior cerebral arteries are unremarkable.  Venous sinuses: No acute abnormality seen within the venous sinuses.  Anatomic variants: No anatomic variant identified.  Delayed phase:No abnormal enhancement on delayed sequence.  IMPRESSION: 1. Acute subarachnoid hemorrhage within the basilar cisterns. No significant mass effect at this time. No hydrocephalus. 2. Small 2 x 3 x 3 mm focal outpouching arising from the anterior communicating artery, suspicious for a small aneurysm. 3. No other acute abnormality identified within the intracranial circulation. Critical Value/emergent results were called by telephone at the time of interpretation on 02/13/2014 at 9:57 pm to Dr. Malachy Moan , who verbally acknowledged these results.   Electronically Signed   By: Jeannine Boga M.D.   On: 02/13/2014 22:00    Review of Systems  Constitutional: Negative.   Eyes: Positive for blurred vision and photophobia.  Gastrointestinal: Positive for nausea.  Genitourinary: Negative.   Musculoskeletal: Positive for neck pain.  Skin: Negative.   Neurological: Positive for tingling, sensory change and headaches.  Psychiatric/Behavioral: Negative.     Blood pressure 142/85, pulse 96, temperature 97.7 F (36.5 C), temperature source Oral, resp. rate 19, SpO2 97 %. Physical Exam  Constitutional: He is oriented to person, place, and time. He appears well-developed and well-nourished.  Eyes: Pupils are equal, round, and reactive to light.  Neck: Normal range of motion.  Respiratory: Effort normal and breath sounds normal.  GI: Soft. Bowel sounds are normal.  Neurological: He is alert and oriented to person, place, and time. He has normal strength. GCS eye subscore is 4. GCS verbal  subscore is 5. GCS motor subscore is 6.  Reflex Scores:      Tricep reflexes are 2+ on the right side and 2+ on the left side.      Bicep reflexes are 2+ on the right side and 2+ on the left side.      Brachioradialis reflexes are 2+ on the right side and 2+ on the left side.      Patellar reflexes are 2+ on the right side and 2+ on the left side.      Achilles reflexes are 2+ on the right side and 2+ on the left side. Patient is awake and alert oriented 4 pupils are equal and reactive extraocular movements are intact and cranial nerves are otherwise intact strength is 5 of 5 in his upper and lower extremities with no pronator drift  Skin: Skin is warm and dry.     Assessment/Plan 54 year old gentleman with a grade 1 subarachnoid hemorrhage and possible anterior communicating artery aneurysm by CTA. I will admit the patient to the neuro ICU will place him on seizure prophylaxis and IV fluids. His CTA did raise the suspicion of possible vasospasm. I've consult and are neurovascularly trained neurosurgeon who is aware the patient and will see first thing in the morning and schedule angiography and possible endovascular correlating of the aneurysm. Patient's  remote history of a headache 2 weeks ago may have been a small sentinel hemorrhage which could possibly explain the onset of vasospasm.  Amit Leece P 02/13/2014, 10:57 PM

## 2014-02-13 NOTE — ED Notes (Signed)
Pt in c/o sudden onset of headache at 6pm tonight, states this is very abnormal for him, pt had a similar headache a few weeks ago that was not as painful and resolved on its own, pt reports pain is to center of head, also dizziness and nausea, pt alert and oriented.

## 2014-02-13 NOTE — ED Notes (Signed)
MD at bedside. 

## 2014-02-13 NOTE — ED Notes (Signed)
Dr. Pollina at bedside   

## 2014-02-14 ENCOUNTER — Inpatient Hospital Stay (HOSPITAL_COMMUNITY): Payer: 59 | Admitting: Certified Registered Nurse Anesthetist

## 2014-02-14 ENCOUNTER — Inpatient Hospital Stay (HOSPITAL_COMMUNITY): Payer: 59

## 2014-02-14 ENCOUNTER — Encounter (HOSPITAL_COMMUNITY): Payer: Self-pay | Admitting: *Deleted

## 2014-02-14 ENCOUNTER — Encounter (HOSPITAL_COMMUNITY)
Admission: EM | Disposition: A | Discharge status: Home / Self Care | Payer: Self-pay | Source: Home / Self Care | Attending: Neurosurgery

## 2014-02-14 DIAGNOSIS — I609 Nontraumatic subarachnoid hemorrhage, unspecified: Principal | ICD-10-CM

## 2014-02-14 HISTORY — PX: RADIOLOGY WITH ANESTHESIA: SHX6223

## 2014-02-14 LAB — MRSA PCR SCREENING: MRSA by PCR: NEGATIVE

## 2014-02-14 SURGERY — RADIOLOGY WITH ANESTHESIA
Anesthesia: Monitor Anesthesia Care

## 2014-02-14 MED ORDER — FENTANYL CITRATE 0.05 MG/ML IJ SOLN
INTRAMUSCULAR | Status: DC | PRN
  Administered 2014-02-14: 25 ug via INTRAVENOUS

## 2014-02-14 MED ORDER — IOHEXOL 300 MG/ML  SOLN
150.0000 mL | Freq: Once | INTRAMUSCULAR | Status: AC | PRN
  Administered 2014-02-14: 60 mL via INTRA_ARTERIAL

## 2014-02-14 MED ORDER — LIDOCAINE HCL 1 % IJ SOLN
INTRAMUSCULAR | Status: AC
  Filled 2014-02-14: qty 20

## 2014-02-14 MED ORDER — MIDAZOLAM HCL 5 MG/5ML IJ SOLN
INTRAMUSCULAR | Status: DC | PRN
  Administered 2014-02-14: 0.5 mg via INTRAVENOUS

## 2014-02-14 MED ORDER — IODIXANOL 320 MG/ML IV SOLN: 100.0000 mL | Freq: Once | INTRAVENOUS | Status: AC | PRN

## 2014-02-14 MED ORDER — HYDROMORPHONE HCL 1 MG/ML IJ SOLN: 0.2500 mg | INTRAMUSCULAR | Status: DC | PRN

## 2014-02-14 NOTE — Op Note (Signed)
DIAGNOSTIC CEREBRAL ANGIOGRAM    OPERATOR:   Dr. Consuella Lose, MD  HISTORY:   The patient is a 54 y.o. yo male who presented to the hospital with sudden onset of severe headache. CT scan was done which demonstrated subarachnoid hemorrhage. The patient presents for diagnostic cerebral angiogram for further workup.  APPROACH:   The technical aspects of the procedure as well as its potential risks and benefits were reviewed with the patient. These risks included but were not limited bleeding, infection, allergic reaction, damage to organs/vital structures, stroke, non-diagnostic procedure, and the catastrophic outcomes of heart attack, coma, and death. With an understanding of these risks, informed consent was obtained and witnessed.    The patient was placed in the supine position on the angiography table and the skin of right groin prepped in the usual sterile fashion. The procedure was performed under local anesthesia (1%-solution of bicarbonate-bufferred Lidoacaine) and conscious sedation with Versed and fentanyl monitored by the in-suite nurse.    A 5- French sheath was introduced in the right common femoral artery using Seldinger technique.  A fluorophase sequence was used to document the sheath position.    HEPARIN: 0 Units total.   CONTRAST AGENT: 80cc, Omnipaque 300   FLUOROSCOPY TIME: 8.1 combined AP and lateral minutes    CATHETER(S) AND WIRE(S):    5-French JB-1 glidecatheter   0.035" glidewire    VESSELS CATHETERIZED:   Right common carotid Right internal carotid   Left internal carotid   Right vertebral   Left vertebral   Right common femoral  VESSELS STUDIED:   Right common carotid: neck: AP and lateral   Right internal carotid: head: AP, lateral, obliques   Right vertebral: AP, lateral, transfacial Left internal carotid: head: AP, lateral, obliques   Left vertebral: AP, lateral   Right femoral: RAO  PROCEDURAL NARRATIVE:   A 5-Fr JB-1 terumo glide catheter  was then advanced over a 0.035 glidewire into the aortic arch and the innominate and right common carotid artery and right internal carotid artery were selected. Cervical and cerebral angiography were performed. The JB-1 was then withdrawn into the innominate artery and the right subclavian artery followed by the right vertebral artery were selected. Cerebral angiography was performed. The JB-1 was then withdrawn into the aortic arch and the left internal carotid artery was selected. Cerebral angiography was performed. The JB-1 catheter was then withdrawn into the aortic arch and the left subclavian artery was selected followed by the left vertebral artery. Cerebral angiography was performed. The JB-1 catheter was removed without incident.    INTERPRETATION:   Right common carotid: neck:   There is no significant stenosis, occlusion, aneurysm or plaque visualized on this injection.    Right internal carotid: head:   Injection reveals the presence of a widely patent ICA, M1, and A1 segments and their branches. There is no significant stenosis, occlusion, aneurysm or high flow vascular malformation visualized. There is a anteriorly and inferiorly projecting loop of the distal right A1 likely responsible for the aneurysmal appearance on the preoperative CT angiogram. No actual saccular aneurysm is identified. The parenchymal and venous phases are normal. The venous sinuses are widely patent.    Left internal carotid: head:   Injection reveals the presence of a widely patent ICA, A1, and M1 segments and their branches. There is no significant stenosis, occlusion, aneurysm, or high flow vascular malformation visualized. The parenchymal and venous phases are normal. The venous sinuses are widely patent.    Right vertebral:  Injection reveals the presence of a widely patent vertebral artery. This leads to a widely patent basilar artery that terminates in bilateral P1. The basilar apex is normal. There is no  significant stenosis, occlusion, aneurysm, or vascular malformation visualized. The parenchymal and venous phases are normal. The venous sinuses are widely patent.    Left vertebral:    The left vertebral artery is noted to arise directly from the aortic arch, just proximal to the left subclavian artery. Normal vessel. No PICA aneurysm. See basilar description above.    Right femoral:    Normal vessel. No significant atherosclerotic disease. Arterial sheath in adequate position.   DISPOSITION:  Upon completion of the study, the femoral sheath was removed and hemostasis obtained using a 5-Fr ExoSeal closure device. Good proximal and distal lower extremity pulses were documented upon achievement of hemostasis.    The procedure was well tolerated and no early complications were observed.       The patient was transferred back to the neuro ICU to be positioned flat in bed for 3 hours of observation.    IMPRESSION:  1. Normal cerebral angiogram, without identified aneurysm, arteriovenous malformation, or high flow fistula. The possible anterior communicating artery aneurysm seen on CT angiogram likely correlates with a vascular loop of the distal right A1, as described above. 2. No significant vasospasm is seen.  The preliminary results of this procedure were shared with the patient and the patient's family.

## 2014-02-14 NOTE — Transfer of Care (Signed)
Immediate Anesthesia Transfer of Care Note  Patient: Jacob Braun  Procedure(s) Performed: Procedure(s): Aneurysm Coiling (N/A)  Patient Location: SICU  Anesthesia Type:MAC  Level of Consciousness: awake, alert , oriented and patient cooperative  Airway & Oxygen Therapy: Patient Spontanous Breathing  Post-op Assessment: Report given to RN, Post -op Vital signs reviewed and stable, Patient moving all extremities, Patient moving all extremities X 4 and Patient able to stick tongue midline  Post vital signs: Reviewed and stable  Last Vitals:  Filed Vitals:   02/14/14 1200  BP: 102/65  Pulse: 103  Temp: 36.8 C  Resp: 19    Complications: No apparent anesthesia complications

## 2014-02-14 NOTE — Progress Notes (Signed)
eLink Physician-Brief Progress Note Patient Name: Jacob Braun DOB: Sep 08, 1960 MRN: [id number]   Date of Service  02/14/2014  HPI/Events of Note  52 M admitted to Neuro ICU with grade 1 SAH.  He is currently HD stable on RA.  Plans for neuro IR in AM  eICU Interventions  Plan of care per primary NS team Had added SCDs for DVT propy Continue to monitor via [facility]     Intervention Category Intermediate Interventions: Best-practice therapies (e.g. DVT, beta blocker, etc.) Evaluation Type: New Patient Evaluation  Arnella Pralle 02/14/2014, 12:27 AM

## 2014-02-14 NOTE — Sedation Documentation (Signed)
5 Fr Exoseal closure by France Ravens, RT.  Gauze, tegederm and pressure dressing applied.

## 2014-02-14 NOTE — Anesthesia Preprocedure Evaluation (Addendum)
Anesthesia Evaluation  Patient identified by MRN, date of birth, ID band Patient awake    Reviewed: Allergy & Precautions, H&P , NPO status , Patient's Chart, lab work & pertinent test results  Airway Mallampati: II  TM Distance: >3 FB Neck ROM: Full    Dental no notable dental hx. (+) Teeth Intact   Pulmonary neg pulmonary ROS, former smoker,  breath sounds clear to auscultation  Pulmonary exam normal       Cardiovascular negative cardio ROS  Rhythm:Regular Rate:Normal     Neuro/Psych  Headaches, negative psych ROS   GI/Hepatic negative GI ROS, Neg liver ROS,   Endo/Other  negative endocrine ROS  Renal/GU negative Renal ROS  negative genitourinary   Musculoskeletal   Abdominal   Peds  Hematology negative hematology ROS (+)   Anesthesia Other Findings   Reproductive/Obstetrics negative OB ROS                           Anesthesia Physical Anesthesia Plan  ASA: II  Anesthesia Plan: General   Post-op Pain Management:    Induction: Intravenous  Airway Management Planned: Oral ETT  Additional Equipment: Arterial line  Intra-op Plan:   Post-operative Plan: Extubation in OR and Possible Post-op intubation/ventilation  Informed Consent: I have reviewed the patients History and Physical, chart, labs and discussed the procedure including the risks, benefits and alternatives for the proposed anesthesia with the patient or authorized representative who has indicated his/her understanding and acceptance.   Dental advisory given  Plan Discussed with: CRNA  Anesthesia Plan Comments:         Anesthesia Quick Evaluation

## 2014-02-14 NOTE — Sedation Documentation (Signed)
See anesthesia notes for VS and meds

## 2014-02-14 NOTE — Progress Notes (Signed)
Pts admission history reviewed. This am he reports significant improvement in his HA. No new sx of N/T/W, visual changes, or N/V.  Of note, he has a hx of tobacco smoking, quit 20 yrs ago. No PMH, No family hx of ICH or aneurysms known.  EXAM:  BP 104/58 mmHg  Pulse 93  Temp(Src) 98.1 F (36.7 C) (Oral)  Resp 15  Ht 5\' 10"  (1.778 m)  Wt 94.8 kg (208 lb 15.9 oz)  BMI 29.99 kg/m2  SpO2 96%  Awake, alert, oriented  Speech fluent, appropriate  CN grossly intact  5/5 BUE/BLE  No drift  IMAGING: CTA reviewed with fairly diffuse basal SAH, No HCP. CTA demonstrates possible small right A1-2 jxn aneurysm although this could represent tortuous right A1.  IMPRESSION:  54 y.o. male [place] 1, Fisher 3 SAH d#2,    PLAN: - Diagnostic cerebral angiogram, with appropriate treatment of any identified aneurysm - Standard spasm prophylaxis (Nimotop/Zocor)  I spoke at length with the patient and his family regarding the imaging findings thus far. I explained to them that intracranial aneurysm was the most common non-traumatic cause for [facility] and that the definitive diagnosis is made by diagnostic angiogram. I also explained to them the possible treatment options for intracranial aneurysms including endovascular coiling and open clip ligation. The risks of the angiogram, coiling, and surgical clipping were also reviewed to include stroke and aneurysm re-rupture leading to weakness/paralysis/coma/death, infection, SZ, hydrocephalus. I also talked to them about the possibility of ventriculostomy for CSF drainage and the risks of this procedure.  The patient and his family understood our discussion and the provided consent to proceed with diagnostic angiogram and the appropriate treatment for any identified aneurysm.

## 2014-02-14 NOTE — Plan of Care (Signed)
Problem: Acute Treatment Outcomes Goal: Other Acute Treatment Outcomes Outcome: Completed/Met Date Met:  02/14/14 Angiogram explained to patient family. Aware of possible coiling and possible craniotomy with clipping.  Patient and family also aware of possible IVC drain placement.  Consents signed and in chart.

## 2014-02-14 NOTE — Anesthesia Postprocedure Evaluation (Signed)
  Anesthesia Post-op Note  Patient: Jacob Braun  Procedure(s) Performed: Procedure(s): Aneurysm Coiling (N/A)  Patient Location: PACU  Anesthesia Type: MAC  Level of Consciousness: awake and alert   Airway and Oxygen Therapy: Patient Spontanous Breathing  Post-op Pain: none  Post-op Assessment: Post-op Vital signs reviewed, Patient's Cardiovascular Status Stable and Respiratory Function Stable  Post-op Vital Signs: Reviewed  Filed Vitals:   02/14/14 1400  BP: 115/62  Pulse: 100  Temp:   Resp: 17    Complications: No apparent anesthesia complications

## 2014-02-14 NOTE — Progress Notes (Signed)
Transcranial Doppler  Date POD PCO2 HCT BP  MCA ACA PCA OPHT SIPH VERT Basilar  02/14/14 JE     Right  Left   59  67   -52  -53   31  34   27  27   51  49   -25  -22   -50           Right  Left                                            Right  Left                                             Right  Left                                             Right  Left                                            Right  Left                                            Right  Left                                        MCA = Middle Cerebral Artery      OPHT = Opthalmic Artery     BASILAR = Basilar Artery   ACA = Anterior Cerebral Artery     SIPH = Carotid Siphon PCA = Posterior Cerebral Artery   VERT = Verterbral Artery                   Normal MCA = 62+\-12 ACA = 50+\-12 PCA = 42+\-23

## 2014-02-14 NOTE — Progress Notes (Signed)
UR completed.  Johnathyn Viscomi, RN BSN [facility] CCM Trauma/Neuro ICU Case Manager [phone]  

## 2014-02-15 MED ORDER — DEXAMETHASONE SODIUM PHOSPHATE 10 MG/ML IJ SOLN
INTRAMUSCULAR | Status: AC
  Filled 2014-02-15: qty 1

## 2014-02-15 MED ORDER — ONDANSETRON HCL 4 MG/2ML IJ SOLN
INTRAMUSCULAR | Status: AC
  Filled 2014-02-15: qty 2

## 2014-02-15 MED ORDER — LEVETIRACETAM 500 MG PO TABS
500.0000 mg | ORAL_TABLET | Freq: Two times a day (BID) | ORAL | Status: DC
  Administered 2014-02-15 – 2014-02-24 (×18): 500 mg via ORAL
  Filled 2014-02-15 (×19): qty 1

## 2014-02-15 MED ORDER — ONDANSETRON HCL 4 MG/2ML IJ SOLN
4.0000 mg | Freq: Four times a day (QID) | INTRAMUSCULAR | Status: DC | PRN
  Administered 2014-02-15 – 2014-02-22 (×7): 4 mg via INTRAVENOUS
  Filled 2014-02-15 (×8): qty 2

## 2014-02-15 MED ORDER — DEXAMETHASONE SODIUM PHOSPHATE 10 MG/ML IJ SOLN
10.0000 mg | Freq: Once | INTRAMUSCULAR | Status: AC
  Administered 2014-02-15: 10 mg via INTRAVENOUS

## 2014-02-15 MED ORDER — HYDROCODONE-ACETAMINOPHEN 5-325 MG PO TABS
1.0000 | ORAL_TABLET | ORAL | Status: DC | PRN
Start: 2014-02-15 — End: 2014-02-22
  Administered 2014-02-15 – 2014-02-16 (×9): 2 via ORAL
  Administered 2014-02-17 (×2): 1 via ORAL
  Administered 2014-02-17 (×2): 2 via ORAL
  Administered 2014-02-17: 1 via ORAL
  Administered 2014-02-17: 2 via ORAL
  Administered 2014-02-17: 1 via ORAL
  Administered 2014-02-18 (×3): 2 via ORAL
  Administered 2014-02-18 (×2): 1 via ORAL
  Administered 2014-02-18 – 2014-02-22 (×22): 2 via ORAL
  Filled 2014-02-15: qty 1
  Filled 2014-02-15 (×7): qty 2
  Filled 2014-02-15 (×2): qty 1
  Filled 2014-02-15 (×3): qty 2
  Filled 2014-02-15: qty 1
  Filled 2014-02-15 (×11): qty 2
  Filled 2014-02-15: qty 1
  Filled 2014-02-15 (×17): qty 2

## 2014-02-15 MED ORDER — PANTOPRAZOLE SODIUM 40 MG PO TBEC
40.0000 mg | DELAYED_RELEASE_TABLET | Freq: Every day | ORAL | Status: DC
  Administered 2014-02-15 – 2014-02-23 (×9): 40 mg via ORAL
  Filled 2014-02-15 (×8): qty 1

## 2014-02-15 NOTE — Progress Notes (Signed)
Transcranial Doppler  Date POD PCO2 HCT BP  MCA ACA PCA OPHT SIPH VERT Basilar  02/14/14 JE     Right  Left   59  67   -52  -58   52  77   82  42   35  36   -25  -22   -50      02-15-14 SB     Right  Left   64  58   -44  -54   23  31/22   13  14    -44  49   -29  -25     -47         Right  Left                                             Right  Left                                             Right  Left                                            Right  Left                                            Right  Left                                        MCA = Middle Cerebral Artery      OPHT = Opthalmic Artery     BASILAR = Basilar Artery   ACA = Anterior Cerebral Artery     SIPH = Carotid Siphon PCA = Posterior Cerebral Artery   VERT = Verterbral Artery                   Normal MCA = 62+\-12 ACA = 50+\-12 PCA = 42+\-23

## 2014-02-15 NOTE — Progress Notes (Signed)
Subjective: Patient reports headache with nausea  Objective: Vital signs in last 24 hours: Temp:  [98 F (36.7 C)-98.4 F (36.9 C)] 98 F (36.7 C) (02/03 0743) Pulse Rate:  [75-109] 76 (02/03 0500) Resp:  [10-22] 19 (02/03 0000) BP: (84-126)/(43-81) 119/71 mmHg (02/03 0500) SpO2:  [95 %-99 %] 97 % (02/03 0500)  Intake/Output from previous day: 02/02 0701 - 02/03 0700 In: 3920 [P.O.:720; I.V.:3000; IV Piggyback:200] Out: 4250 [Urine:4250] Intake/Output this shift:    Physical Exam: Awake, alert, complaining of stiff neck  Lab Results:  Recent Labs  02/13/14 2309  WBC 11.1*  HGB 15.3  HCT 43.6  PLT 201   BMET  Recent Labs  02/13/14 2309  NA 138  K 4.1  CL 106  CO2 24  GLUCOSE 151*  BUN 12  CREATININE 1.10  CALCIUM 9.1    Studies/Results: Ct Angio Head W/cm &/or Wo Cm  02/13/2014   EXAM: CT ANGIOGRAPHY HEAD  TECHNIQUE: Multidetector CT imaging of the head was performed using the standard protocol during bolus administration of intravenous contrast. Multiplanar CT image reconstructions and MIPs were obtained to evaluate the vascular anatomy.  CONTRAST:  69mL OMNIPAQUE IOHEXOL 350 MG/ML SOLN  COMPARISON:  None.  FINDINGS: CT HEAD  Brain: Precontrast imaging of the brain demonstrates diffuse hyperdensity within the basilar cisterns, compatible with acute subarachnoid hemorrhage. No associated mass effect. No hydrocephalus.  No acute large vessel territory infarct. Gray-white matter differentiation maintained.  No extra-axial fluid collection.  No mass lesion.  No midline shift  Calvarium and skull base: Calvarium intact. No abnormality seen about the skullbase.  Paranasal sinuses: Paranasal sinuses are well pneumatized and free of fluid. No mastoid effusion.  Orbits: No acute abnormality seen about the orbits.  CTA HEAD  Anterior circulation: Visualized distal sulcal segments of the internal carotid arteries are widely well opacified without acute abnormality. The petrous,  cavernous, and supra clinoid segments of the internal carotid arteries well opacified. A1 segments mildly irregular bilaterally, which may be related to Vasa spasm given the presence of subarachnoid hemorrhage.  There is a suspected small irregular focal outpouching arising from the anterior communicating artery, suspicious for a small aneurysm. This measures approximately 3 x 3 x 2 mm, and is directed slightly superiorly and anteriorly.  Anterior communicating arteries well opacified distally.  M1 segments widely patent without occlusion or stenosis. No aneurysm or other abnormality seen at the MCA bifurcations. Distal MCA branches within normal limits.  Posterior circulation: Vertebral arteries appear to be codominant. Posterior inferior cerebellar arteries within normal limits. Vertebrobasilar junction and basilar artery are unremarkable. No basilar tip aneurysm. Superior cerebellar arteries within normal limits. Posterior cerebral arteries are unremarkable.  Venous sinuses: No acute abnormality seen within the venous sinuses.  Anatomic variants: No anatomic variant identified.  Delayed phase:No abnormal enhancement on delayed sequence.  IMPRESSION: 1. Acute subarachnoid hemorrhage within the basilar cisterns. No significant mass effect at this time. No hydrocephalus. 2. Small 2 x 3 x 3 mm focal outpouching arising from the anterior communicating artery, suspicious for a small aneurysm. 3. No other acute abnormality identified within the intracranial circulation. Critical Value/emergent results were called by telephone at the time of interpretation on 02/13/2014 at 9:57 pm to Dr. Malachy Moan , who verbally acknowledged these results.   Electronically Signed   By: Jeannine Boga M.D.   On: 02/13/2014 22:00    Assessment/Plan: Patient doing well.  I spoke with patient and his wife and answered their questions.  Plan  and management per Dr. Kathyrn Sheriff.    LOS: 2 days    Peggyann Shoals, MD 02/15/2014,  8:26 AM

## 2014-02-16 ENCOUNTER — Encounter (HOSPITAL_COMMUNITY): Payer: Self-pay | Admitting: Neurosurgery

## 2014-02-16 NOTE — Progress Notes (Signed)
Chaplain initiated visit with pt and family. Pt reports feeling like God may be telling him to "slow down." Pt became teary at points, but seems to be processing through his feelings surrounding this hospitalization. Pt and family are supported by church and pastor has come to visit. Chaplain offered prayer, emotional support, and assessed emotional coping practices . Chaplain will continue to follow.    02/16/14 1100  Clinical Encounter Type  Visited With Patient and family together  Visit Type Initial;Spiritual support  Recommendations Follow Up  Spiritual Encounters  Spiritual Needs Emotional;Prayer  Stress Factors  Patient Stress Factors Major life changes  Family Stress Factors Major life changes  Nyal Schachter, Barbette Hair, Chaplain 02/16/2014 11:43 AM

## 2014-02-16 NOTE — Progress Notes (Signed)
Pt seen and examined. No issues overnight. Had N/V this am, but better now. Able to walk around the unit yesterday.  EXAM: Temp:  [97.8 F (36.6 C)-98.3 F (36.8 C)] 98.3 F (36.8 C) (02/03 2000) Pulse Rate:  [63-93] 81 (02/04 0900) Resp:  [13-21] 19 (02/04 0900) BP: (100-126)/(58-78) 111/60 mmHg (02/04 0900) SpO2:  [89 %-99 %] 95 % (02/04 0900) Intake/Output      02/03 0701 - 02/04 0700 02/04 0701 - 02/05 0700   P.O.  240   I.V. (mL/kg) 3000 (31.6) 250 (2.6)   IV Piggyback     Total Intake(mL/kg) 3000 (31.6) 490 (5.2)   Urine (mL/kg/hr) 7875 (3.5) 875 (4.4)   Emesis/NG output 0 (0)    Stool 0 (0)    Total Output 7875 875   Net -4875 -385        Urine Occurrence 1 x    Stool Occurrence 1 x    Emesis Occurrence 1 x     Awake, alert, oriented Speech fluent CN intact Good strength throughout  LABS: Lab Results  Component Value Date   CREATININE 1.10 02/13/2014   BUN 12 02/13/2014   NA 138 02/13/2014   K 4.1 02/13/2014   CL 106 02/13/2014   CO2 24 02/13/2014   Lab Results  Component Value Date   WBC 11.1* 02/13/2014   HGB 15.3 02/13/2014   HCT 43.6 02/13/2014   MCV 85.8 02/13/2014   PLT 201 02/13/2014    IMAGING: TCD velicites reviewed, essentially normal.  IMPRESSION: - 54 y.o. male angio neg SAH d# 4, neurologically intact  PLAN: - Cont observation - Will plan on repeat diagnostic angiogram next week.

## 2014-02-17 NOTE — Progress Notes (Signed)
UR completed. No HH needs anticipated.  Katherina Wimer, RN BSN [facility] CCM Trauma/Neuro ICU Case Manager [phone]  

## 2014-02-17 NOTE — Progress Notes (Signed)
Transcranial Doppler  Date POD PCO2 HCT BP  MCA ACA PCA OPHT SIPH VERT Basilar  02/14/14 JE     Right  Left   59  67   -52  -53   31  34   27  27   51  49   -25  -22   -50      02-15-14 SB     Right  Left   64  58   -44  -54   23  31/22   13  14    -44  49   -29  -25     -47    02-17-14 SB     Right  Left   69  72   -53  -67   -20  41   9  11   42  58   -15  -19     -45          Right  Left                                             Right  Left                                            Right  Left                                            Right  Left                                        MCA = Middle Cerebral Artery      OPHT = Opthalmic Artery     BASILAR = Basilar Artery   ACA = Anterior Cerebral Artery     SIPH = Carotid Siphon PCA = Posterior Cerebral Artery   VERT = Verterbral Artery                   Normal MCA = 62+\-12 ACA = 50+\-12 PCA = 42+\-23

## 2014-02-17 NOTE — Progress Notes (Signed)
Pt seen and examined. No issues overnight. Pt has HA, some nausea. No visual changes, N/T/W.  EXAM: Temp:  [97.7 F (36.5 C)-98.5 F (36.9 C)] 98.5 F (36.9 C) (02/05 1138) Pulse Rate:  [61-88] 63 (02/05 0700) Resp:  [12-24] 13 (02/05 0700) BP: (105-147)/(64-81) 127/77 mmHg (02/05 0700) SpO2:  [92 %-98 %] 94 % (02/05 0700) Intake/Output      02/04 0701 - 02/05 0700 02/05 0701 - 02/06 0700   P.O. 920    I.V. (mL/kg) 3000 (31.6)    Total Intake(mL/kg) 3920 (41.4)    Urine (mL/kg/hr) 6250 (2.7)    Emesis/NG output     Stool     Total Output 6250     Net -2330           Awake, alert, oriented Speech fluent CN Intact Good strength throughout  LABS: Lab Results  Component Value Date   CREATININE 1.10 02/13/2014   BUN 12 02/13/2014   NA 138 02/13/2014   K 4.1 02/13/2014   CL 106 02/13/2014   CO2 24 02/13/2014   Lab Results  Component Value Date   WBC 11.1* 02/13/2014   HGB 15.3 02/13/2014   HCT 43.6 02/13/2014   MCV 85.8 02/13/2014   PLT 201 02/13/2014    IMPRESSION: - 54 y.o. male angio neg SAH d# 5, neurologically intact  PLAN: - Cont observation in ICU - Will repeat angio next week.

## 2014-02-18 NOTE — Progress Notes (Signed)
Patient ID: Jacob Braun, male   DOB: 10/12/1960, 54 y.o.   MRN: [id number] Vital signs are stable Patient complains of moderate headache No step pain medicine lasts nearly 3 hours of the 4 hour interval during which she receives more medication He has been ambulatory No evidence of a drift Speech is clear and fluent Continue to observe

## 2014-02-19 NOTE — Progress Notes (Signed)
Patient ID: Jacob Braun, male   DOB: 1960/05/06, 55 y.o.   MRN: [id number] Vital signs are stable Patient is alert Is out of bed eating breakfast Headaches tend to come and go Currently feeling reasonably well

## 2014-02-20 MED ORDER — NIMODIPINE 60 MG/20ML PO SOLN
60.0000 mg | ORAL | Status: DC
  Administered 2014-02-20 – 2014-02-21 (×6): 60 mg via ORAL
  Filled 2014-02-20 (×12): qty 20

## 2014-02-20 MED ORDER — NIMODIPINE 30 MG PO CAPS
60.0000 mg | ORAL_CAPSULE | ORAL | Status: DC
  Administered 2014-02-20: 60 mg via ORAL
  Filled 2014-02-20 (×9): qty 2

## 2014-02-20 NOTE — Progress Notes (Addendum)
Transcranial Doppler  Date POD PCO2 HCT BP  MCA ACA PCA OPHT SIPH VERT Basilar  02/14/14 JE     Right  Left   59  67   -52  -53   31  34   27  27   51  49   -25  -22   -50      02-15-14 SB     Right  Left   64  58   -44  -54   23  31/22   13  14    -44  49   -29  -25     -47    02-17-14 SB     Right  Left   69  72   -53  -67   -20  41   9  11   42  58   -15  -19     -45     02-20-14 VS     Right  Left   73  86   -68  -64   72  53   17  22   32  43   -56  -45     -83    2/10/ 16 MS      Right  Left   84  114   -70  -83   -24  50   19  16   31   *   -26  -33   *           Right  Left                                            Right  Left                                        MCA = Middle Cerebral Artery      OPHT = Opthalmic Artery     BASILAR = Basilar Artery   ACA = Anterior Cerebral Artery     SIPH = Carotid Siphon PCA = Posterior Cerebral Artery   VERT = Verterbral Artery                   Normal MCA = 62+\-12 ACA = 50+\-12 PCA = 42+\-23   *Unable to visualize.  02/22/2014 4:44 PM Maudry Mayhew, RVT, RDCS, RDMS

## 2014-02-20 NOTE — Progress Notes (Signed)
Pt seen and examined. No issues overnight. Has intermittent HA. Otherwise well.  EXAM: Temp:  [97.4 F (36.3 C)-99.3 F (37.4 C)] 98.5 F (36.9 C) (02/08 0800) Resp:  [8-23] 14 (02/08 0800) BP: (86-129)/(50-80) 111/72 mmHg (02/08 0800) SpO2:  [95 %-97 %] 95 % (02/08 0400) Intake/Output      02/07 0701 - 02/08 0700 02/08 0701 - 02/09 0700   P.O. 840    I.V. (mL/kg) 2875 (30.3)    Total Intake(mL/kg) 3715 (39.2)    Urine (mL/kg/hr)     Stool     Total Output       Net +3715          Urine Occurrence 8 x     Awake, alert, oriented CN intact Good strength  LABS: Lab Results  Component Value Date   CREATININE 1.10 02/13/2014   BUN 12 02/13/2014   NA 138 02/13/2014   K 4.1 02/13/2014   CL 106 02/13/2014   CO2 24 02/13/2014   Lab Results  Component Value Date   WBC 11.1* 02/13/2014   HGB 15.3 02/13/2014   HCT 43.6 02/13/2014   MCV 85.8 02/13/2014   PLT 201 02/13/2014    IMPRESSION: - 54 y.o. male [place] d#8, angio negative. Neurologically intact  PLAN: - Repeat angio this week - Cont observation

## 2014-02-20 NOTE — Progress Notes (Signed)
UR completed.  Alean Kromer, RN BSN [facility] CCM Trauma/Neuro ICU Case Manager [phone]  

## 2014-02-21 ENCOUNTER — Inpatient Hospital Stay (HOSPITAL_COMMUNITY): Payer: 59

## 2014-02-21 MED ORDER — FENTANYL CITRATE 0.05 MG/ML IJ SOLN
INTRAMUSCULAR | Status: AC | PRN
  Administered 2014-02-21: 25 ug via INTRAVENOUS

## 2014-02-21 MED ORDER — MIDAZOLAM HCL 2 MG/2ML IJ SOLN
INTRAMUSCULAR | Status: AC
Start: 2014-02-21 — End: 2014-02-22
  Filled 2014-02-21: qty 2

## 2014-02-21 MED ORDER — IOHEXOL 300 MG/ML  SOLN
150.0000 mL | Freq: Once | INTRAMUSCULAR | Status: AC | PRN
  Administered 2014-02-21: 50 mL via INTRA_ARTERIAL

## 2014-02-21 MED ORDER — LIDOCAINE HCL 1 % IJ SOLN
INTRAMUSCULAR | Status: AC
  Filled 2014-02-21: qty 20

## 2014-02-21 MED ORDER — HEPARIN SOD (PORK) LOCK FLUSH 100 UNIT/ML IV SOLN
INTRAVENOUS | Status: AC
  Filled 2014-02-21: qty 10

## 2014-02-21 MED ORDER — FENTANYL CITRATE 0.05 MG/ML IJ SOLN
INTRAMUSCULAR | Status: AC
  Filled 2014-02-21: qty 2

## 2014-02-21 MED ORDER — MIDAZOLAM HCL 2 MG/2ML IJ SOLN
INTRAMUSCULAR | Status: AC | PRN
  Administered 2014-02-21: 0.5 mg via INTRAVENOUS

## 2014-02-21 MED ORDER — NIMODIPINE 30 MG PO CAPS
60.0000 mg | ORAL_CAPSULE | ORAL | Status: DC
  Administered 2014-02-21 – 2014-02-24 (×19): 60 mg via ORAL
  Filled 2014-02-21 (×24): qty 2

## 2014-02-21 NOTE — Sedation Documentation (Addendum)
Patient denies pain and is resting comfortably.  

## 2014-02-21 NOTE — Sedation Documentation (Signed)
Patient denies pain and is resting comfortably.  

## 2014-02-21 NOTE — Progress Notes (Signed)
Pt seen and examined. No issues overnight. Pt has HA, episode of N/V this am.  EXAM: Temp:  [98.1 F (36.7 C)-99.1 F (37.3 C)] 98.8 F (37.1 C) (02/09 0700) Pulse Rate:  [66-92] 92 (02/09 0900) Resp:  [13-27] 22 (02/09 0900) BP: (108-139)/(67-87) 122/71 mmHg (02/09 0900) SpO2:  [90 %-99 %] 97 % (02/09 0900) Intake/Output      02/08 0701 - 02/09 0700 02/09 0701 - 02/10 0700   P.O. 1713 400   I.V. (mL/kg) 2750 (29) 250 (2.6)   Total Intake(mL/kg) 4463 (47.1) 650 (6.9)   Net +4463 +650        Urine Occurrence 6 x 1 x   Stool Occurrence 1 x    Emesis Occurrence  1 x    Awake, alert, oriented Speech fluent CN intact Good strength throughout  LABS: Lab Results  Component Value Date   CREATININE 1.10 02/13/2014   BUN 12 02/13/2014   NA 138 02/13/2014   K 4.1 02/13/2014   CL 106 02/13/2014   CO2 24 02/13/2014   Lab Results  Component Value Date   WBC 11.1* 02/13/2014   HGB 15.3 02/13/2014   HCT 43.6 02/13/2014   MCV 85.8 02/13/2014   PLT 201 02/13/2014    IMAGING: TCD velocities normal  IMPRESSION: - 54 y.o. male SAH d# 9, angio negative. Neurologically stable.  PLAN: - Repeat angio today

## 2014-02-22 MED ORDER — HYDROMORPHONE HCL 2 MG PO TABS
2.0000 mg | ORAL_TABLET | ORAL | Status: DC | PRN
  Administered 2014-02-22: 2 mg via ORAL
  Filled 2014-02-22: qty 1

## 2014-02-22 NOTE — Progress Notes (Signed)
Pt seen and examined. No issues overnight. Continues to c/o HA. No other new c/o.  EXAM: Temp:  [97.5 F (36.4 C)-99.4 F (37.4 C)] 97.5 F (36.4 C) (02/10 0756) Pulse Rate:  [66-85] 78 (02/10 0800) Resp:  [13-26] 18 (02/10 0800) BP: (97-144)/(59-89) 117/79 mmHg (02/10 0700) SpO2:  [93 %-100 %] 98 % (02/10 0800) Intake/Output      02/09 0701 - 02/10 0700 02/10 0701 - 02/11 0700   P.O. 400    I.V. (mL/kg) 2875 (30.3) 125 (1.3)   Total Intake(mL/kg) 3275 (34.5) 125 (1.3)   Net +3275 +125        Urine Occurrence 3 x    Stool Occurrence 1 x    Emesis Occurrence 1 x     Awake, alert, oriented Speech fluent CN intact Good strength  LABS: Lab Results  Component Value Date   CREATININE 1.10 02/13/2014   BUN 12 02/13/2014   NA 138 02/13/2014   K 4.1 02/13/2014   CL 106 02/13/2014   CO2 24 02/13/2014   Lab Results  Component Value Date   WBC 11.1* 02/13/2014   HGB 15.3 02/13/2014   HCT 43.6 02/13/2014   MCV 85.8 02/13/2014   PLT 201 02/13/2014    IMPRESSION: - 54 y.o. male [place] d# 10, angiogram (-) x 2  PLAN: - Transfer to floor - Cont to mobilize - Will likely d/c home on Fri

## 2014-02-23 MED ORDER — FENOFIBRATE 54 MG PO TABS
108.0000 mg | ORAL_TABLET | Freq: Two times a day (BID) | ORAL | Status: DC
  Administered 2014-02-23 – 2014-02-24 (×2): 108 mg via ORAL
  Filled 2014-02-23 (×4): qty 2

## 2014-02-23 MED ORDER — FAMOTIDINE 20 MG PO TABS
20.0000 mg | ORAL_TABLET | Freq: Every day | ORAL | Status: DC
  Administered 2014-02-23 – 2014-02-24 (×2): 20 mg via ORAL
  Filled 2014-02-23 (×2): qty 1

## 2014-02-23 MED ORDER — HYDROMORPHONE HCL 2 MG PO TABS
1.0000 mg | ORAL_TABLET | ORAL | Status: DC | PRN
  Administered 2014-02-23 – 2014-02-24 (×6): 1 mg via ORAL
  Filled 2014-02-23 (×6): qty 1

## 2014-02-23 NOTE — Progress Notes (Signed)
Subjective: Patient reports doing better today  Objective: Vital signs in last 24 hours: Temp:  [98 F (36.7 C)-99.4 F (37.4 C)] 98 F (36.7 C) (02/11 0700) Pulse Rate:  [68-82] 81 (02/11 0600) Resp:  [13-21] 14 (02/11 0600) BP: (110-131)/(65-109) 111/68 mmHg (02/11 0600) SpO2:  [93 %-98 %] 95 % (02/11 0600)  Intake/Output from previous day: 02/10 0701 - 02/11 0700 In: 3000 [I.V.:3000] Out: -  Intake/Output this shift:    Physical Exam: Awake, alert, conversant.  Still c/o HA,  Lab Results: No results for input(s): WBC, HGB, HCT, PLT in the last 72 hours. BMET No results for input(s): NA, K, CL, CO2, GLUCOSE, BUN, CREATININE, CALCIUM in the last 72 hours.  Studies/Results: No results found.  Assessment/Plan: Mobilize, transfer to floor. Dilaudid 1 mg po Q 3 H prn    LOS: 10 days    Ellison Leisure D, MD 02/23/2014, 8:21 AM

## 2014-02-23 NOTE — Progress Notes (Signed)
UR completed.  Trayvon Trumbull, RN BSN [facility] CCM Trauma/Neuro ICU Case Manager [phone]  

## 2014-02-24 MED ORDER — ONDANSETRON HCL 4 MG PO TABS: 4.0000 mg | ORAL_TABLET | Freq: Three times a day (TID) | ORAL | Status: DC | PRN

## 2014-02-24 MED ORDER — HYDROMORPHONE HCL 2 MG PO TABS: 1.0000 mg | ORAL_TABLET | ORAL | Status: DC | PRN

## 2014-02-24 NOTE — Progress Notes (Signed)
Transcranial Doppler  Date POD PCO2 HCT BP  MCA ACA PCA OPHT SIPH VERT Basilar  02/14/14 JE     Right  Left   59  67   -52  -53   31  34   27  27   51  49   -25  -22   -50      02-15-14 SB     Right  Left   64  58   -44  -54   23  31/22   13  14    -44  49   -29  -25     -47    02-17-14 SB     Right  Left   69  72   -53  -67   -20  41   9  11   42  58   -15  -19     -45     02-20-14 VS     Right  Left   73  86   -68  -64   72  53   17  22   32  43   -56  -45     -83    2/10/ 16 MS      Right  Left   84  114   -70  -83   -24  50   19  16   31   *   -26  -33   *      02-24-14 VS     Right  Left   62  86   -71  -74   66  54   21  20   22  22    -63  -53   -75           Right  Left                                        MCA = Middle Cerebral Artery      OPHT = Opthalmic Artery     BASILAR = Basilar Artery   ACA = Anterior Cerebral Artery     SIPH = Carotid Siphon PCA = Posterior Cerebral Artery   VERT = Verterbral Artery                   Normal MCA = 62+\-12 ACA = 50+\-12 PCA = 42+\-23   *Unable to visualize.  02/24/2014 6:22 PM  [facility], [place]

## 2014-02-24 NOTE — Discharge Summary (Signed)
  Physician Discharge Summary  Patient ID: Jacob Braun MRN: [id number] DOB/AGE: 05/25/1960 54 y.o.  Admit date: 02/13/2014 Discharge date: 02/24/2014  Admission Diagnoses: Subarachnoid Hemorrhage  Discharge Diagnoses: Same Active Problems:   SAH (subarachnoid hemorrhage)   Discharged Condition: Stable  Hospital Course:  Mrs. Jacob Braun is a 54 y.o. male admitted with sudden onset of severe headache. CT scan was done which demonstrated subarachnoid hemorrhage, and the possibility of an anterior communicating artery aneurysm. Diagnostic cerebral angiogram was therefore performed within 24 hours of admission which did not demonstrate any intracranial aneurysms. The patient was observed in the intensive care unit without any significant events. He did have some headache and episodes of nausea and vomiting which were controlled with oral medication. He underwent follow-up diagnostic cerebral angiogram approximately one week after admission which again did not demonstrate any intracranial aneurysms, AVMs, or fistulas. After 2 more days of observation, the patient was discharged in stable neurologic condition. He was ambulating well, with good strength in all extremities, and therefore did not require any rehabilitation services upon discharge.  Treatments:  diagnsotic cerebral angiogram 02/14/14, 02/21/14  Discharge Exam: Blood pressure 105/79, pulse 89, temperature 98 F (36.7 C), temperature source Oral, resp. rate 18, height 5\' 10"  (1.778 m), weight 94.8 kg (208 lb 15.9 oz), SpO2 96 %. Awake, alert, oriented Speech fluent, appropriate CN grossly intact 5/5 BUE/BLE  Follow-up: Follow-up in my office [facility] [phone]) in 2-4 weeks  Disposition: 01-Home or Self Care     Medication List    TAKE these medications        celecoxib 200 MG capsule  Commonly known as:  CELEBREX  Take 200 mg by mouth daily.     EPINEPHrine 0.3 mg/0.3 mL Soaj injection   Commonly known as:  EPIPEN  Inject 0.3 mLs (0.3 mg total) into the muscle as needed.     famotidine 20 MG tablet  Commonly known as:  PEPCID  Take 20 mg by mouth as needed.     fenofibrate 54 MG tablet  Take 108 mg by mouth 2 (two) times daily.     HYDROmorphone 2 MG tablet  Commonly known as:  DILAUDID  Take 0.5 tablets (1 mg total) by mouth every 4 (four) hours as needed for severe pain.     ondansetron 4 MG tablet  Commonly known as:  ZOFRAN  Take 1 tablet (4 mg total) by mouth every 8 (eight) hours as needed for nausea or vomiting.         SignedJairo Ben 02/24/2014, 3:38 PM

## 2014-06-29 ENCOUNTER — Other Ambulatory Visit: Payer: Self-pay | Admitting: Family Medicine

## 2014-06-29 DIAGNOSIS — K824 Cholesterolosis of gallbladder: Secondary | ICD-10-CM

## 2014-07-04 ENCOUNTER — Ambulatory Visit
Admission: RE | Admit: 2014-07-04 | Discharge: 2014-07-04 | Disposition: A | Discharge status: Home / Self Care | Payer: 59 | Source: Ambulatory Visit | Attending: Family Medicine | Admitting: Family Medicine

## 2014-07-04 DIAGNOSIS — K824 Cholesterolosis of gallbladder: Secondary | ICD-10-CM

## 2015-03-06 ENCOUNTER — Other Ambulatory Visit: Payer: Self-pay | Admitting: Nurse Practitioner

## 2015-04-09 ENCOUNTER — Other Ambulatory Visit: Payer: Self-pay | Admitting: Family Medicine

## 2015-04-09 DIAGNOSIS — K824 Cholesterolosis of gallbladder: Secondary | ICD-10-CM

## 2015-04-16 ENCOUNTER — Ambulatory Visit
Admission: RE | Admit: 2015-04-16 | Discharge: 2015-04-16 | Disposition: A | Discharge status: Home / Self Care | Payer: Commercial Managed Care - HMO | Source: Ambulatory Visit | Attending: Family Medicine | Admitting: Family Medicine

## 2015-04-16 DIAGNOSIS — K824 Cholesterolosis of gallbladder: Secondary | ICD-10-CM

## 2015-07-16 ENCOUNTER — Other Ambulatory Visit (HOSPITAL_COMMUNITY): Payer: Self-pay | Admitting: Family Medicine

## 2015-07-16 DIAGNOSIS — R634 Abnormal weight loss: Secondary | ICD-10-CM

## 2015-07-19 ENCOUNTER — Encounter (INDEPENDENT_AMBULATORY_CARE_PROVIDER_SITE_OTHER): Payer: Self-pay | Admitting: Internal Medicine

## 2015-07-23 ENCOUNTER — Ambulatory Visit (HOSPITAL_COMMUNITY)
Admission: RE | Admit: 2015-07-23 | Discharge: 2015-07-23 | Disposition: A | Discharge status: Home / Self Care | Payer: Commercial Managed Care - HMO | Source: Ambulatory Visit | Attending: Family Medicine | Admitting: Family Medicine

## 2015-07-23 ENCOUNTER — Other Ambulatory Visit (HOSPITAL_COMMUNITY): Payer: Self-pay | Admitting: Family Medicine

## 2015-07-23 DIAGNOSIS — M5136 Other intervertebral disc degeneration, lumbar region: Secondary | ICD-10-CM | POA: Diagnosis not present

## 2015-07-23 DIAGNOSIS — M5134 Other intervertebral disc degeneration, thoracic region: Secondary | ICD-10-CM | POA: Insufficient documentation

## 2015-07-23 DIAGNOSIS — R634 Abnormal weight loss: Secondary | ICD-10-CM | POA: Insufficient documentation

## 2015-07-23 MED ORDER — IOPAMIDOL (ISOVUE-300) INJECTION 61%
100.0000 mL | Freq: Once | INTRAVENOUS | Status: AC | PRN
  Administered 2015-07-23: 100 mL via INTRAVENOUS

## 2015-08-06 ENCOUNTER — Encounter (INDEPENDENT_AMBULATORY_CARE_PROVIDER_SITE_OTHER): Payer: Self-pay | Admitting: Internal Medicine

## 2015-08-06 ENCOUNTER — Ambulatory Visit (INDEPENDENT_AMBULATORY_CARE_PROVIDER_SITE_OTHER): Payer: Commercial Managed Care - HMO | Admitting: Internal Medicine

## 2015-08-06 ENCOUNTER — Other Ambulatory Visit (INDEPENDENT_AMBULATORY_CARE_PROVIDER_SITE_OTHER): Payer: Self-pay | Admitting: Internal Medicine

## 2015-08-06 ENCOUNTER — Encounter (INDEPENDENT_AMBULATORY_CARE_PROVIDER_SITE_OTHER): Payer: Self-pay | Admitting: *Deleted

## 2015-08-06 ENCOUNTER — Telehealth (INDEPENDENT_AMBULATORY_CARE_PROVIDER_SITE_OTHER): Payer: Self-pay | Admitting: *Deleted

## 2015-08-06 VITALS — BP 104/70 | HR 72 | Temp 97.6°F | Ht 70.0 in | Wt 181.6 lb

## 2015-08-06 DIAGNOSIS — R933 Abnormal findings on diagnostic imaging of other parts of digestive tract: Secondary | ICD-10-CM | POA: Diagnosis not present

## 2015-08-06 DIAGNOSIS — R634 Abnormal weight loss: Secondary | ICD-10-CM

## 2015-08-06 DIAGNOSIS — R198 Other specified symptoms and signs involving the digestive system and abdomen: Secondary | ICD-10-CM

## 2015-08-06 MED ORDER — PEG 3350-KCL-NA BICARB-NACL 420 G PO SOLR: 4000.0000 mL | Freq: Once | ORAL | 0 refills | Status: AC

## 2015-08-06 NOTE — Telephone Encounter (Signed)
Patient needs trilyte, he is aware it isn't on his formulary

## 2015-08-06 NOTE — Telephone Encounter (Signed)
Patient needs trilyte 

## 2015-08-06 NOTE — Patient Instructions (Signed)
The risks and benefits such as perforation, bleeding, and infection were reviewed with the patient and is agreeable. 

## 2015-08-06 NOTE — Progress Notes (Addendum)
Subjective:    Patient ID: Jacob Braun, male    DOB: 09/11/1960, 55 y.o.   MRN: [id number]  HPI Referred by Dr. Harless Litten for weight loss. He tells me he has lost close to 60 pounds over the past year which was unintentional.  He tells me he has a rectal discharge at times which is not consistent. After he walks he will have a rectal discharge. Discharge Symptoms of rectal discharge x 8 months.  He has a BM daily. No melena or BRRB.  Stools are normal. Appetite is not good. He says he skips meals. He says he had a subarachnoid hemorrhage in 2016. Site of bleeding not found per patient.  In hospital x 12 day at [place], February 13, 2014.    Wife left message after OV and husband has some type of nerve near his groing that runs up to the buttocks at times.         No family hx of colon cancer. 07/23/2015 CT abdomen/pelvis with CM: weight loss of 60 pounds: IMPRESSION: 1. There is no definite evidence of abdominal mass. 2. No hydronephrosis or hydroureter. 3. Normal appendix.  No pericecal inflammation. 4. Limited assessment of distal sigmoid colon and rectum which are empty. Nonspecific mild thickening of distal sigmoid colon wall. No definite mass is noted. Further evaluation with colonoscopy is recommended. 5. Degenerative changes thoracic and lumbar spine. There is bilateral pars defect at L5 level. Normal colonoscopy except small external hemorrhoids.     10/17/02/2011:   Colonoscopy  Indications:  Patient is 55 year old Caucasian male undergoing average risk screening colonoscopy. Normal colonoscopy except small external hemorrhoids.   Review of Systems Past Medical History:  Diagnosis Date  . Gallbladder mass   . Hemorrhoids   . Hyperlipidemia   . Medical history non-contributory     Past Surgical History:  Procedure Laterality Date  . CARPAL TUNNEL RELEASE  2008 & 2009  . CHEST SURGERY  1983   exploratory surgery   . COLONOSCOPY  10/30/2011   Procedure: COLONOSCOPY;  Surgeon: Rogene Houston, MD;  Location: [facility];  Service: Endoscopy;  Laterality: N/A;  1030  . KNEE CARTILAGE SURGERY  2006   Right  . RADIOLOGY WITH ANESTHESIA N/A 02/14/2014   Procedure: Aneurysm Coiling;  Surgeon: Consuella Lose, MD;  Location: [facility];  Service: Radiology;  Laterality: N/A;  . ULNAR TUNNEL RELEASE  2009   Left  . VARICOCELECTOMY  1992  . VASECTOMY  1999    Allergies  Allergen Reactions  . Oxycodone Rash    Current Outpatient Prescriptions on File Prior to Visit  Medication Sig Dispense Refill  . celecoxib (CELEBREX) 200 MG capsule Take 200 mg by mouth daily.  1  . EPINEPHrine (EPIPEN) 0.3 mg/0.3 mL SOAJ injection Inject 0.3 mLs (0.3 mg total) into the muscle as needed. (Patient not taking: Reported on 08/06/2015) 1 Device 1  . famotidine (PEPCID) 20 MG tablet Take 20 mg by mouth as needed.    . fenofibrate 54 MG tablet Take 108 mg by mouth 2 (two) times daily.    Marland Kitchen HYDROmorphone (DILAUDID) 2 MG tablet Take 0.5 tablets (1 mg total) by mouth every 4 (four) hours as needed for severe pain. (Patient not taking: Reported on 08/06/2015) 90 tablet 0  . ondansetron (ZOFRAN) 4 MG tablet Take 1 tablet (4 mg total) by mouth every 8 (eight) hours as needed for nausea or vomiting. (Patient not taking: Reported on 08/06/2015) 20 tablet 0  No current facility-administered medications on file prior to visit.   Meds I could not find in System:  UltraNutrient. One a day Curcumasorb one a day.      Objective:   Physical Exam Blood pressure 104/70, pulse 72, temperature 97.6 F (36.4 C), height 5\' 10"  (1.778 m), weight 181 lb 9.6 oz (82.4 kg).  Alert and oriented. Skin warm and dry. Oral mucosa is moist.   . Sclera anicteric, conjunctivae is pink. Thyroid not enlarged. No cervical lymphadenopathy. Lungs clear. Heart regular rate and rhythm.  Abdomen is soft. Bowel sounds are positive. No hepatomegaly. No abdominal masses felt. No tenderness.  No  edema to lower extremities.  Stool brown and guaiac negative.               Assessment & Plan:  Abnormal CT, Rectal discharge. Colonic neoplasm needs to be ruled out.  The risks and benefits such as perforation, bleeding, and infection were reviewed with the patient and is agreeable.

## 2015-08-07 ENCOUNTER — Encounter (INDEPENDENT_AMBULATORY_CARE_PROVIDER_SITE_OTHER): Payer: Self-pay

## 2015-08-07 ENCOUNTER — Encounter (INDEPENDENT_AMBULATORY_CARE_PROVIDER_SITE_OTHER): Payer: Self-pay | Admitting: Internal Medicine

## 2015-08-12 ENCOUNTER — Encounter (INDEPENDENT_AMBULATORY_CARE_PROVIDER_SITE_OTHER): Payer: Self-pay | Admitting: Internal Medicine

## 2015-08-17 ENCOUNTER — Encounter (HOSPITAL_COMMUNITY): Payer: Self-pay | Admitting: *Deleted

## 2015-08-17 ENCOUNTER — Encounter (HOSPITAL_COMMUNITY)
Admission: RE | Disposition: A | Discharge status: Home / Self Care | Payer: Self-pay | Source: Ambulatory Visit | Attending: Internal Medicine

## 2015-08-17 ENCOUNTER — Ambulatory Visit (HOSPITAL_COMMUNITY)
Admission: RE | Admit: 2015-08-17 | Discharge: 2015-08-17 | Disposition: A | Discharge status: Home / Self Care | Payer: Commercial Managed Care - HMO | Source: Ambulatory Visit | Attending: Internal Medicine | Admitting: Internal Medicine

## 2015-08-17 DIAGNOSIS — Z87891 Personal history of nicotine dependence: Secondary | ICD-10-CM | POA: Diagnosis not present

## 2015-08-17 DIAGNOSIS — E785 Hyperlipidemia, unspecified: Secondary | ICD-10-CM | POA: Diagnosis not present

## 2015-08-17 DIAGNOSIS — K6289 Other specified diseases of anus and rectum: Secondary | ICD-10-CM | POA: Diagnosis not present

## 2015-08-17 DIAGNOSIS — R634 Abnormal weight loss: Secondary | ICD-10-CM | POA: Diagnosis not present

## 2015-08-17 DIAGNOSIS — K648 Other hemorrhoids: Secondary | ICD-10-CM | POA: Diagnosis not present

## 2015-08-17 DIAGNOSIS — Z791 Long term (current) use of non-steroidal anti-inflammatories (NSAID): Secondary | ICD-10-CM | POA: Diagnosis not present

## 2015-08-17 DIAGNOSIS — Z79899 Other long term (current) drug therapy: Secondary | ICD-10-CM | POA: Insufficient documentation

## 2015-08-17 DIAGNOSIS — R198 Other specified symptoms and signs involving the digestive system and abdomen: Secondary | ICD-10-CM

## 2015-08-17 DIAGNOSIS — R933 Abnormal findings on diagnostic imaging of other parts of digestive tract: Secondary | ICD-10-CM | POA: Diagnosis not present

## 2015-08-17 DIAGNOSIS — Z6825 Body mass index (BMI) 25.0-25.9, adult: Secondary | ICD-10-CM | POA: Diagnosis not present

## 2015-08-17 DIAGNOSIS — K644 Residual hemorrhoidal skin tags: Secondary | ICD-10-CM

## 2015-08-17 DIAGNOSIS — R935 Abnormal findings on diagnostic imaging of other abdominal regions, including retroperitoneum: Secondary | ICD-10-CM | POA: Diagnosis not present

## 2015-08-17 DIAGNOSIS — D123 Benign neoplasm of transverse colon: Secondary | ICD-10-CM | POA: Diagnosis not present

## 2015-08-17 HISTORY — DX: Traumatic subarachnoid hemorrhage with loss of consciousness of unspecified duration, initial encounter: S06.6X9A

## 2015-08-17 HISTORY — DX: Nontraumatic subarachnoid hemorrhage, unspecified: I60.9

## 2015-08-17 HISTORY — DX: Unspecified coma: R40.20

## 2015-08-17 HISTORY — PX: COLONOSCOPY: SHX5424

## 2015-08-17 SURGERY — COLONOSCOPY
Anesthesia: Moderate Sedation

## 2015-08-17 MED ORDER — MIDAZOLAM HCL 5 MG/5ML IJ SOLN
INTRAMUSCULAR | Status: DC | PRN
  Administered 2015-08-17: 2 mg via INTRAVENOUS
  Administered 2015-08-17: 1 mg via INTRAVENOUS
  Administered 2015-08-17: 2 mg via INTRAVENOUS
  Administered 2015-08-17: 3 mg via INTRAVENOUS

## 2015-08-17 MED ORDER — STERILE WATER FOR IRRIGATION IR SOLN
Status: DC | PRN
  Administered 2015-08-17: 2.5 mL

## 2015-08-17 MED ORDER — PSYLLIUM 28 % PO PACK: 1.0000 | PACK | Freq: Every day | ORAL | Status: DC

## 2015-08-17 MED ORDER — MIDAZOLAM HCL 5 MG/5ML IJ SOLN
INTRAMUSCULAR | Status: AC
  Filled 2015-08-17: qty 10

## 2015-08-17 MED ORDER — MEPERIDINE HCL 50 MG/ML IJ SOLN
INTRAMUSCULAR | Status: DC | PRN
  Administered 2015-08-17 (×2): 25 mg via INTRAVENOUS

## 2015-08-17 MED ORDER — SODIUM CHLORIDE 0.9 % IV SOLN
INTRAVENOUS | Status: DC
  Administered 2015-08-17: 13:00:00 via INTRAVENOUS

## 2015-08-17 MED ORDER — MEPERIDINE HCL 50 MG/ML IJ SOLN
INTRAMUSCULAR | Status: AC
  Filled 2015-08-17: qty 1

## 2015-08-17 NOTE — Op Note (Signed)
[facility] Patient Name: Jacob Braun Procedure Date: 08/17/2015 1:36 PM MRN: [id number] Date of Birth: 1960-03-25 Attending MD: Hildred Laser , MD CSN: [id number] Age: 55 Admit Type: Outpatient Procedure:                Colonoscopy Indications:              Abnormal CT of the GI tract Providers:                Hildred Laser, MD, Lurline Del, RN, Randa Spike,                            Technician Referring MD:             Edwyna Shell. Jacelyn Grip, MD Medicines:                Meperidine 50 mg IV, Midazolam 8 mg IV Complications:            No immediate complications. Estimated Blood Loss:     Estimated blood loss was minimal. Procedure:                Pre-Anesthesia Assessment:                           - Prior to the procedure, a History and Physical                            was performed, and patient medications and                            allergies were reviewed. The patient's tolerance of                            previous anesthesia was also reviewed. The risks                            and benefits of the procedure and the sedation                            options and risks were discussed with the patient.                            All questions were answered, and informed consent                            was obtained. Prior Anticoagulants: The patient                            last took previous NSAID medication 7 days prior to                            the procedure. ASA Grade Assessment: II - A patient                            with mild systemic disease. After reviewing the  risks and benefits, the patient was deemed in                            satisfactory condition to undergo the procedure.                           After obtaining informed consent, the colonoscope                            was passed under direct vision. Throughout the                            procedure, the patient's blood pressure, pulse, and                oxygen saturations were monitored continuously. The                            [id number]) scope was introduced through                            the anus and advanced to the the terminal ileum,                            with identification of the appendiceal orifice and                            IC valve. The colonoscopy was performed without                            difficulty. The patient tolerated the procedure                            well. The quality of the bowel preparation was                            adequate. The terminal ileum, ileocecal valve,                            appendiceal orifice, and rectum were photographed. Scope In: 3:02:38 PM Scope Out: 3:24:42 PM Scope Withdrawal Time: 0 hours 14 minutes 4 seconds  Total Procedure Duration: 0 hours 22 minutes 4 seconds  Findings:      A 5 mm polyp was found in the splenic flexure. The polyp was sessile.       The polyp was removed with a cold snare. Resection and retrieval were       complete.      The exam was otherwise without abnormality.      External and internal hemorrhoids were found during retroflexion. The       hemorrhoids were small. Impression:               - One 5 mm polyp at the splenic flexure, removed                            with a cold snare. Resected and retrieved.                           -  The examination was otherwise normal.                           - External and internal hemorrhoids. Moderate Sedation:      Moderate (conscious) sedation was administered by the endoscopy nurse       and supervised by the endoscopist. The following parameters were       monitored: oxygen saturation, heart rate, blood pressure, CO2       capnography and response to care. Total physician intraservice time was       28 minutes. Recommendation:           - Patient has a contact number available for                            emergencies. The signs and symptoms of potential                             delayed complications were discussed with the                            patient. Return to normal activities tomorrow.                            Written discharge instructions were provided to the                            patient.                           - High fiber diet today.                           - Continue present medications.                           - Await pathology results.                           - Repeat colonoscopy for surveillance based on                            pathology results.                           - Use sugar-free Metamucil one tablespoon PO daily. Procedure Code(s):        --- Professional ---                           [phone], Colonoscopy, flexible; with removal of                            tumor(s), polyp(s), or other lesion(s) by snare                            technique  [id number], Moderate sedation services provided by the                            same physician or other qualified health care                            professional performing the diagnostic or                            therapeutic service that the sedation supports,                            requiring the presence of an independent trained                            observer to assist in the monitoring of the                            patient's level of consciousness and physiological                            status; initial 15 minutes of intraservice time,                            patient age 28 years or older                           [phone], Moderate sedation services; each additional                            15 minutes intraservice time Diagnosis Code(s):        --- Professional ---                           D12.3, Benign neoplasm of transverse colon (hepatic                            flexure or splenic flexure)                           K64.8, Other hemorrhoids                           R93.3, Abnormal findings on diagnostic  imaging of                            other parts of digestive tract CPT copyright 2016 [employer]. All rights reserved. The codes documented in this report are preliminary and upon coder review may  be revised to meet current compliance requirements. Hildred Laser, MD Hildred Laser, MD 08/17/2015 3:33:43 PM This report has been signed electronically. Number of Addenda: 0

## 2015-08-17 NOTE — Discharge Instructions (Signed)
Resume usual medications and high fiber diet. Metamucil 3-4 g by half a packet daily at bedtime. No driving for 24 hours. Physician will call with biopsy results.     Colonoscopy, Care After These instructions give you information on caring for yourself after your procedure. Your doctor may also give you more specific instructions. Call your doctor if you have any problems or questions after your procedure. HOME CARE  Do not drive for 24 hours.  Do not sign important papers or use machinery for 24 hours.  You may shower.  You may go back to your usual activities, but go slower for the first 24 hours.  Take rest breaks often during the first 24 hours.  Walk around or use warm packs on your belly (abdomen) if you have belly cramping or gas.  Drink enough fluids to keep your pee (urine) clear or pale yellow.  Resume your normal diet. Avoid heavy or fried foods.  Avoid drinking alcohol for 24 hours or as told by your doctor.  Only take medicines as told by your doctor. If a tissue sample (biopsy) was taken during the procedure:   Do not take aspirin or blood thinners for 7 days, or as told by your doctor.  Do not drink alcohol for 7 days, or as told by your doctor.  Eat soft foods for the first 24 hours. GET HELP IF: You still have a small amount of blood in your poop (stool) 2-3 days after the procedure. GET HELP RIGHT AWAY IF:  You have more than a small amount of blood in your poop.  You see clumps of tissue (blood clots) in your poop.  Your belly is puffy (swollen).  You feel sick to your stomach (nauseous) or throw up (vomit).  You have a fever.  You have belly pain that gets worse and medicine does not help. MAKE SURE YOU:  Understand these instructions.  Will watch your condition.  Will get help right away if you are not doing well or get worse.   This information is not intended to replace advice given to you by your health care provider. Make sure you  discuss any questions you have with your health care provider.   Document Released: 02/01/2010 Document Revised: 01/04/2013 Document Reviewed: 09/06/2012 Elsevier Interactive Patient Education 2016 Elsevier Inc.    Colon Polyps Polyps are lumps of extra tissue growing inside the body. Polyps can grow in the large intestine (colon). Most colon polyps are noncancerous (benign). However, some colon polyps can become cancerous over time. Polyps that are larger than a pea may be harmful. To be safe, caregivers remove and test all polyps. CAUSES  Polyps form when mutations in the genes cause your cells to grow and divide even though no more tissue is needed. RISK FACTORS There are a number of risk factors that can increase your chances of getting colon polyps. They include:  Being older than 50 years.  Family history of colon polyps or colon cancer.  Long-term colon diseases, such as colitis or Crohn disease.  Being overweight.  Smoking.  Being inactive.  Drinking too much alcohol. SYMPTOMS  Most small polyps do not cause symptoms. If symptoms are present, they may include:  Blood in the stool. The stool may look dark red or black.  Constipation or diarrhea that lasts longer than 1 week. DIAGNOSIS People often do not know they have polyps until their caregiver finds them during a regular checkup. Your caregiver can use 4 tests to check  for polyps:  Digital rectal exam. The caregiver wears gloves and feels inside the rectum. This test would find polyps only in the rectum.  Barium enema. The caregiver puts a liquid called barium into your rectum before taking X-rays of your colon. Barium makes your colon look white. Polyps are dark, so they are easy to see in the X-ray pictures.  Sigmoidoscopy. A thin, flexible tube (sigmoidoscope) is placed into your rectum. The sigmoidoscope has a light and tiny camera in it. The caregiver uses the sigmoidoscope to look at the last third of your  colon.  Colonoscopy. This test is like sigmoidoscopy, but the caregiver looks at the entire colon. This is the most common method for finding and removing polyps. TREATMENT  Any polyps will be removed during a sigmoidoscopy or colonoscopy. The polyps are then tested for cancer. PREVENTION  To help lower your risk of getting more colon polyps:  Eat plenty of fruits and vegetables. Avoid eating fatty foods.  Do not smoke.  Avoid drinking alcohol.  Exercise every day.  Lose weight if recommended by your caregiver.  Eat plenty of calcium and folate. Foods that are rich in calcium include milk, cheese, and broccoli. Foods that are rich in folate include chickpeas, kidney beans, and spinach. HOME CARE INSTRUCTIONS Keep all follow-up appointments as directed by your caregiver. You may need periodic exams to check for polyps. SEEK MEDICAL CARE IF: You notice bleeding during a bowel movement.   This information is not intended to replace advice given to you by your health care provider. Make sure you discuss any questions you have with your health care provider.   Document Released: 09/26/2003 Document Revised: 01/20/2014 Document Reviewed: 03/11/2011 Elsevier Interactive Patient Education 2016 [employer].   Hemorrhoids Hemorrhoids are swollen veins around the rectum or anus. There are two types of hemorrhoids:   Internal hemorrhoids. These occur in the veins just inside the rectum. They may poke through to the outside and become irritated and painful.  External hemorrhoids. These occur in the veins outside the anus and can be felt as a painful swelling or hard lump near the anus. CAUSES  Pregnancy.   Obesity.   Constipation or diarrhea.   Straining to have a bowel movement.   Sitting for long periods on the toilet.  Heavy lifting or other activity that caused you to strain.  Anal intercourse. SYMPTOMS   Pain.   Anal itching or irritation.   Rectal bleeding.    Fecal leakage.   Anal swelling.   One or more lumps around the anus.  DIAGNOSIS  Your caregiver may be able to diagnose hemorrhoids by visual examination. Other examinations or tests that may be performed include:   Examination of the rectal area with a gloved hand (digital rectal exam).   Examination of anal canal using a small tube (scope).   A blood test if you have lost a significant amount of blood.  A test to look inside the colon (sigmoidoscopy or colonoscopy). TREATMENT Most hemorrhoids can be treated at home. However, if symptoms do not seem to be getting better or if you have a lot of rectal bleeding, your caregiver may perform a procedure to help make the hemorrhoids get smaller or remove them completely. Possible treatments include:   Placing a rubber band at the base of the hemorrhoid to cut off the circulation (rubber band ligation).   Injecting a chemical to shrink the hemorrhoid (sclerotherapy).   Using a tool to burn the hemorrhoid (infrared  light therapy).   Surgically removing the hemorrhoid (hemorrhoidectomy).   Stapling the hemorrhoid to block blood flow to the tissue (hemorrhoid stapling).  HOME CARE INSTRUCTIONS   Eat foods with fiber, such as whole grains, beans, nuts, fruits, and vegetables. Ask your doctor about taking products with added fiber in them (fibersupplements).  Increase fluid intake. Drink enough water and fluids to keep your urine clear or pale yellow.   Exercise regularly.   Go to the bathroom when you have the urge to have a bowel movement. Do not wait.   Avoid straining to have bowel movements.   Keep the anal area dry and clean. Use wet toilet paper or moist towelettes after a bowel movement.   Medicated creams and suppositories may be used or applied as directed.   Only take over-the-counter or prescription medicines as directed by your caregiver.   Take warm sitz baths for 15-20 minutes, 3-4 times a day  to ease pain and discomfort.   Place ice packs on the hemorrhoids if they are tender and swollen. Using ice packs between sitz baths may be helpful.   Put ice in a plastic bag.   Place a towel between your skin and the bag.   Leave the ice on for 15-20 minutes, 3-4 times a day.   Do not use a donut-shaped pillow or sit on the toilet for long periods. This increases blood pooling and pain.  SEEK MEDICAL CARE IF:  You have increasing pain and swelling that is not controlled by treatment or medicine.  You have uncontrolled bleeding.  You have difficulty or you are unable to have a bowel movement.  You have pain or inflammation outside the area of the hemorrhoids. MAKE SURE YOU:  Understand these instructions.  Will watch your condition.  Will get help right away if you are not doing well or get worse.   This information is not intended to replace advice given to you by your health care provider. Make sure you discuss any questions you have with your health care provider.   Document Released: 12/28/1999 Document Revised: 12/17/2011 Document Reviewed: 11/04/2011 Elsevier Interactive Patient Education 2016 Elsevier Inc.   High-Fiber Diet Fiber, also called dietary fiber, is a type of carbohydrate found in fruits, vegetables, whole grains, and beans. A high-fiber diet can have many health benefits. Your health care provider may recommend a high-fiber diet to help:  Prevent constipation. Fiber can make your bowel movements more regular.  Lower your cholesterol.  Relieve hemorrhoids, uncomplicated diverticulosis, or irritable bowel syndrome.  Prevent overeating as part of a weight-loss plan.  Prevent heart disease, type 2 diabetes, and certain cancers. WHAT IS MY PLAN? The recommended daily intake of fiber includes:  38 grams for men under age 57.  72 grams for men over age 43.  9 grams for women under age 68.  37 grams for women over age 38. You can get the  recommended daily intake of dietary fiber by eating a variety of fruits, vegetables, grains, and beans. Your health care provider may also recommend a fiber supplement if it is not possible to get enough fiber through your diet. WHAT DO I NEED TO KNOW ABOUT A HIGH-FIBER DIET?  Fiber supplements have not been widely studied for their effectiveness, so it is better to get fiber through food sources.  Always check the fiber content on thenutrition facts label of any prepackaged food. Look for foods that contain at least 5 grams of fiber per serving.  Ask  your dietitian if you have questions about specific foods that are related to your condition, especially if those foods are not listed in the following section.  Increase your daily fiber consumption gradually. Increasing your intake of dietary fiber too quickly may cause bloating, cramping, or gas.  Drink plenty of water. Water helps you to digest fiber. WHAT FOODS CAN I EAT? Grains Whole-grain breads. Multigrain cereal. Oats and oatmeal. Brown rice. Barley. Bulgur wheat. Beltrami. Bran muffins. Popcorn. Rye wafer crackers. Vegetables Sweet potatoes. Spinach. Kale. Artichokes. Cabbage. Broccoli. Green peas. Carrots. Squash. Fruits Berries. Pears. Apples. Oranges. Avocados. Prunes and raisins. Dried figs. Meats and Other Protein Sources Navy, kidney, pinto, and soy beans. Split peas. Lentils. Nuts and seeds. Dairy Fiber-fortified yogurt. Beverages Fiber-fortified soy milk. Fiber-fortified orange juice. Other Fiber bars. The items listed above may not be a complete list of recommended foods or beverages. Contact your dietitian for more options. WHAT FOODS ARE NOT RECOMMENDED? Grains White bread. Pasta made with refined flour. White rice. Vegetables Fried potatoes. Canned vegetables. Well-cooked vegetables.  Fruits Fruit juice. Cooked, strained fruit. Meats and Other Protein Sources Fatty cuts of meat. Fried Sales executive or fried  fish. Dairy Milk. Yogurt. Cream cheese. Sour cream. Beverages Soft drinks. Other Cakes and pastries. Butter and oils. The items listed above may not be a complete list of foods and beverages to avoid. Contact your dietitian for more information. WHAT ARE SOME TIPS FOR INCLUDING HIGH-FIBER FOODS IN MY DIET?  Eat a wide variety of high-fiber foods.  Make sure that half of all grains consumed each day are whole grains.  Replace breads and cereals made from refined flour or white flour with whole-grain breads and cereals.  Replace white rice with brown rice, bulgur wheat, or millet.  Start the day with a breakfast that is high in fiber, such as a cereal that contains at least 5 grams of fiber per serving.  Use beans in place of meat in soups, salads, or pasta.  Eat high-fiber snacks, such as berries, raw vegetables, nuts, or popcorn.   This information is not intended to replace advice given to you by your health care provider. Make sure you discuss any questions you have with your health care provider.   Document Released: 12/30/2004 Document Revised: 01/20/2014 Document Reviewed: 06/14/2013 Elsevier Interactive Patient Education [employer].

## 2015-08-17 NOTE — H&P (Signed)
Jacob Braun is an 55 y.o. male.   Chief Complaint: Patient is here for colonoscopy. HPI: Patient is 55 year old Caucasian male who is undergoing diagnostic colonoscopy. He had spontaneous subdural bleed back in February 2016 treated conservatively. Since then he has lost 60 pounds. He says his appetite is back and his weight loss has leveled off. He had abdominopelvic CT which revealed thickening to sigmoid colon. He has noted intermittent rectal discharge but no bleeding. He also complains of numbness in perineal region going to inner aspect of right thigh. This does not happen every day. Family history is negative for CRC.  Past Medical History:  Diagnosis Date  . Gallbladder polyp   . Hemorrhoids   . Hyperlipidemia   . Medical history non-contributory   . Subarchnoid hemorrhage-brief coma [facility])     Past Surgical History:  Procedure Laterality Date  . CARPAL TUNNEL RELEASE  2008 & 2009  . CHEST SURGERY  1983   exploratory surgery   . COLONOSCOPY  10/30/2011   Procedure: COLONOSCOPY;  Surgeon: Rogene Houston, MD;  Location: [facility];  Service: Endoscopy;  Laterality: N/A;  1030  . KNEE CARTILAGE SURGERY  2006   Right  . RADIOLOGY WITH ANESTHESIA N/A 02/14/2014   Procedure: Aneurysm Coiling;  Surgeon: Consuella Lose, MD;  Location: [place];  Service: Radiology;  Laterality: N/A;  . ULNAR TUNNEL RELEASE  2009   Left  . VARICOCELECTOMY  1992  . VASECTOMY  1999    Family History  Problem Relation Age of Onset  . Hypertension Mother   . Diabetes Mother   . COPD Mother   . Heart disease Mother 103    CHF  . Cancer Father    Social History:  reports that he quit smoking about 18 years ago. He has never used smokeless tobacco. He reports that he does not drink alcohol or use drugs.  Allergies:  Allergies  Allergen Reactions  . Oxycodone Rash    Medications Prior to Admission  Medication Sig Dispense Refill  . hydrOXYzine (ATARAX/VISTARIL) 25 MG tablet Take 25 mg by  mouth 3 (three) times daily as needed.    . pravastatin (PRAVACHOL) 10 MG tablet Take 10 mg by mouth daily.    . celecoxib (CELEBREX) 200 MG capsule Take 200 mg by mouth daily.  1  . EPINEPHrine (EPIPEN) 0.3 mg/0.3 mL SOAJ injection Inject 0.3 mLs (0.3 mg total) into the muscle as needed. (Patient not taking: Reported on 08/06/2015) 1 Device 1  . famotidine (PEPCID) 20 MG tablet Take 20 mg by mouth as needed.    . fenofibrate 54 MG tablet Take 108 mg by mouth 2 (two) times daily.    Marland Kitchen HYDROmorphone (DILAUDID) 2 MG tablet Take 0.5 tablets (1 mg total) by mouth every 4 (four) hours as needed for severe pain. (Patient not taking: Reported on 08/06/2015) 90 tablet 0  . lactobacillus acidophilus (BACID) TABS tablet Take 2 tablets by mouth 3 (three) times daily.    . ondansetron (ZOFRAN) 4 MG tablet Take 1 tablet (4 mg total) by mouth every 8 (eight) hours as needed for nausea or vomiting. (Patient not taking: Reported on 08/06/2015) 20 tablet 0    No results found for this or any previous visit (from the past 48 hour(s)). No results found.  ROS  Blood pressure (!) 125/100, pulse (!) 59, temperature 97.9 F (36.6 C), temperature source Oral, resp. rate 12, height 5\' 10"  (1.778 m), weight 181 lb (82.1 kg), SpO2 100 %.  Physical Exam  Constitutional: He appears well-developed and well-nourished.  HENT:  Mouth/Throat: Oropharynx is clear and moist.  Eyes: Conjunctivae are normal. No scleral icterus.  Neck: No thyromegaly present.  Cardiovascular: Normal rate, regular rhythm and normal heart sounds.   No murmur heard. Respiratory: Effort normal and breath sounds normal.  GI: Soft. He exhibits no distension and no mass. There is no tenderness.  Musculoskeletal: He exhibits no edema.  Lymphadenopathy:    He has no cervical adenopathy.  Neurological: He is alert.  Skin: Skin is warm and dry.     Assessment/Plan Thickened sigmoid colon on abdominopelvic CT. Weight loss possibly related to subdural  hematoma that he suffered last year. Rectal discharge. Diagnostic colonoscopy.  Hildred Laser, MD 08/17/2015, 2:50 PM

## 2015-08-22 ENCOUNTER — Encounter (INDEPENDENT_AMBULATORY_CARE_PROVIDER_SITE_OTHER): Payer: Self-pay | Admitting: Internal Medicine

## 2015-08-23 ENCOUNTER — Other Ambulatory Visit: Payer: Self-pay | Admitting: Family Medicine

## 2015-08-23 ENCOUNTER — Encounter (HOSPITAL_COMMUNITY): Payer: Self-pay | Admitting: Internal Medicine

## 2015-08-23 DIAGNOSIS — R159 Full incontinence of feces: Secondary | ICD-10-CM

## 2015-08-28 ENCOUNTER — Ambulatory Visit
Admission: RE | Admit: 2015-08-28 | Discharge: 2015-08-28 | Disposition: A | Discharge status: Home / Self Care | Payer: Commercial Managed Care - HMO | Source: Ambulatory Visit | Attending: Family Medicine | Admitting: Family Medicine

## 2015-08-28 DIAGNOSIS — R159 Full incontinence of feces: Secondary | ICD-10-CM

## 2015-12-13 IMAGING — XA IR ANGIO VETEBRAL SEL VERTEBRAL BILAT MOD SED
9 of 10 series · 12 of 24 positions shown · IV contrast (IODINE)
Comparison: none

PROCEDURE:
DIAGNOSTIC CEREBRAL ANGIOGRAM

OPERATOR:
Dr. Ph, Dyablo
HISTORY: The patient is a 53 y.o. yo male who presented to the hospital with
sudden onset of severe headache. CT scan was done which demonstrated
subarachnoid hemorrhage. Patient underwent diagnostic angiogram one
week ago which was negative, and presents for follow-up diagnostic
angiogram.

[Series 1: carotid 1 · 2 acquisitions, 1 frame shown (1 of 8)]
[im 1/2]
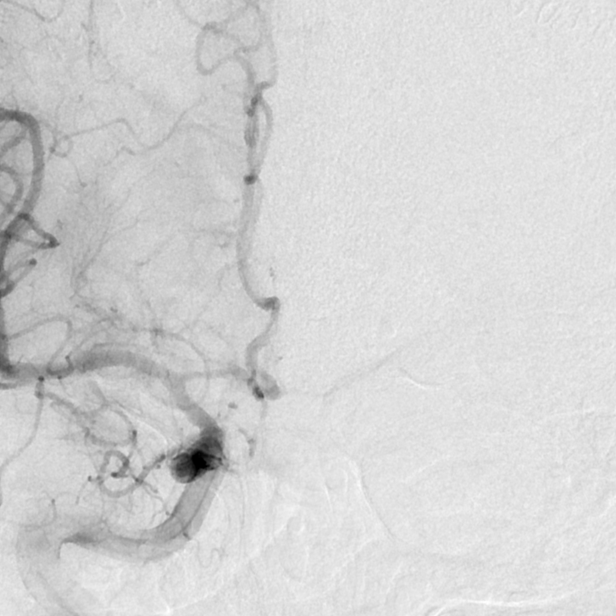

[Series 2: carotid 1 · 2 acquisitions, 2 frames shown (2 of 8)]
[im 1/2]
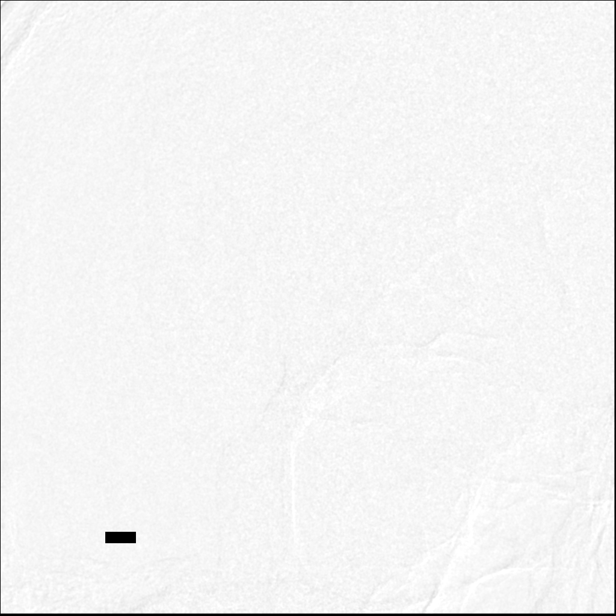
[im 2/2]
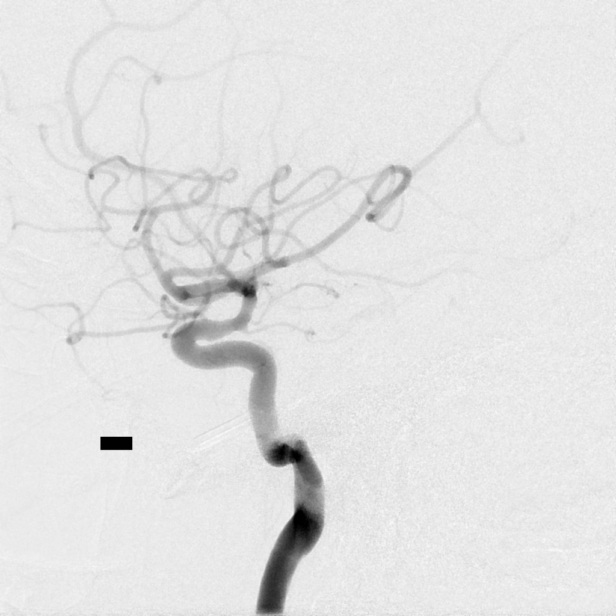

[Series 3: carotid 1 · 2 acquisitions, 1 frame shown (3 of 8)]
[im 1/2]
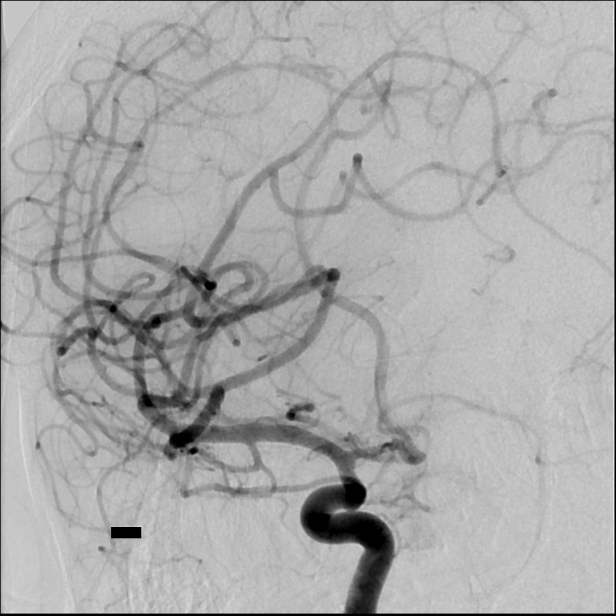

[Series 4: carotid 1 · 2 acquisitions, 1 frame shown (4 of 8)]
[im 1/2]
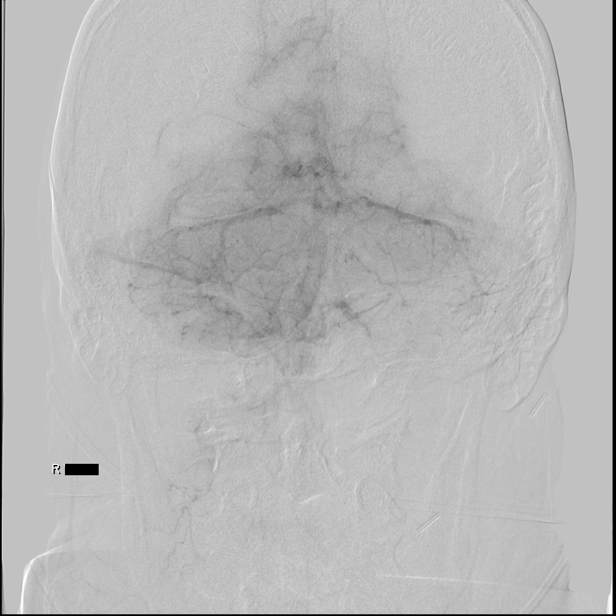

[Series 6: carotid 1 · 2 acquisitions, 1 frame shown (5 of 8)]
[im 1/2]
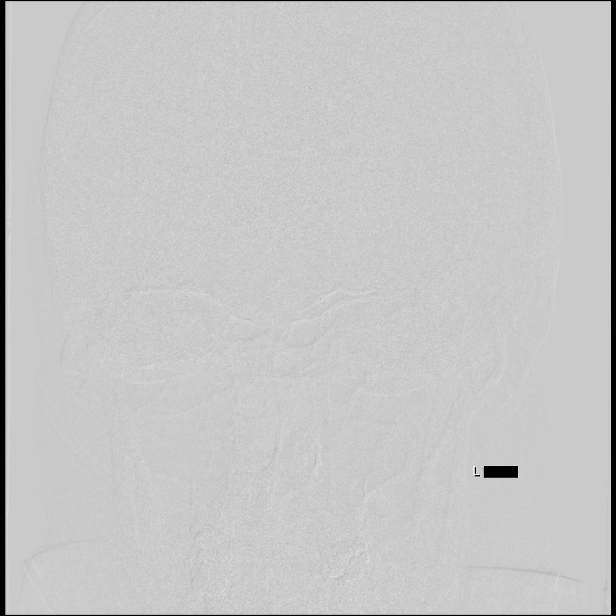

[Series 7: carotid 1 · 2 acquisitions, 1 frame shown (6 of 8)]
[im 1/2]
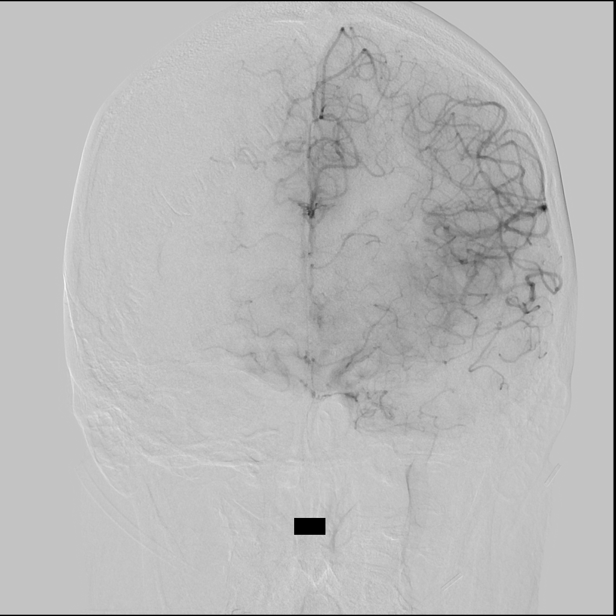

[Series 8: carotid 1 · 2 acquisitions, 1 frame shown (7 of 8)]
[im 1/2]
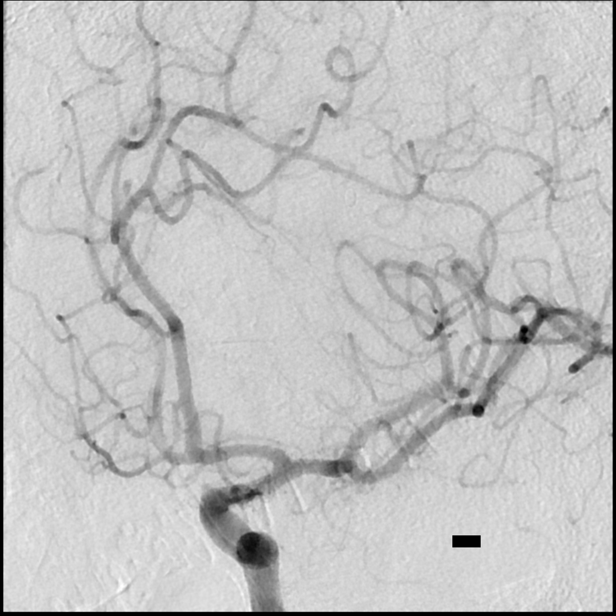

[Series 9: carotid 1 · 2 acquisitions, 1 frame shown (8 of 8)]
[im 1/2]
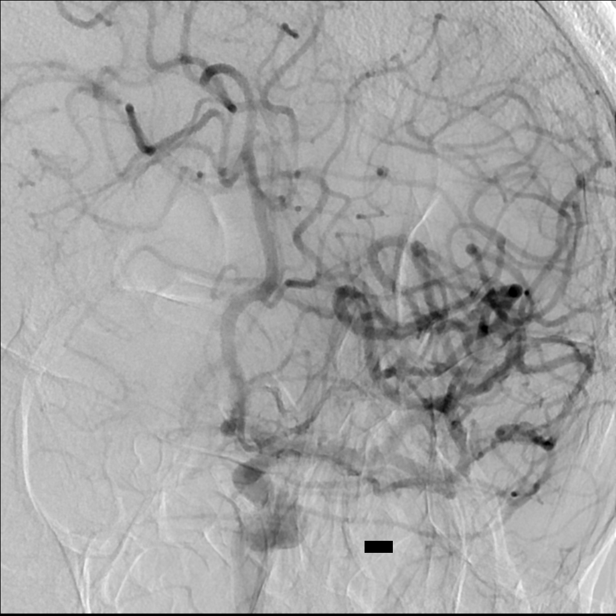

[Series 300: neuro · 3 of 22 slices shown]
[im 6/22]
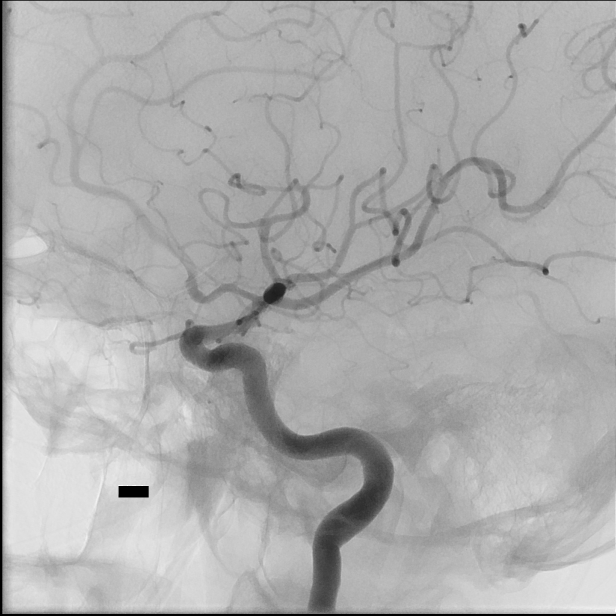
[im 13/22]
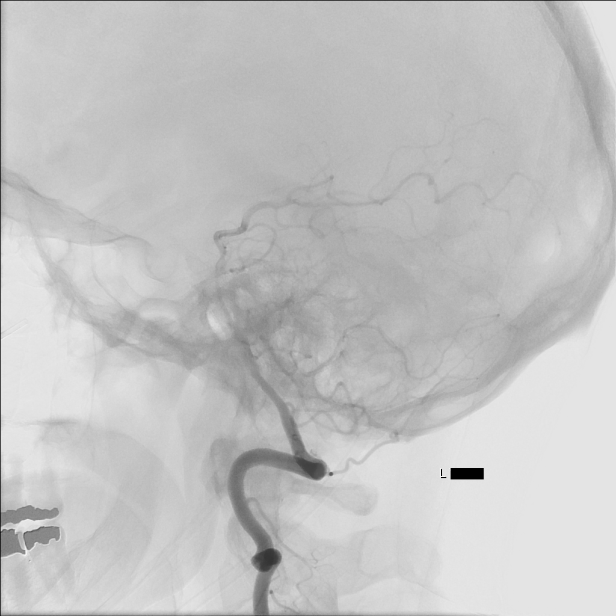
[im 22/22]
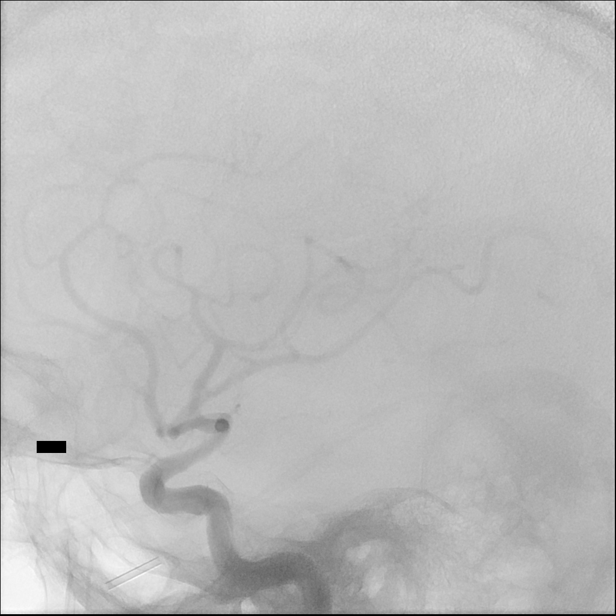

[12 of 24 positions shown; findings below may reference images not displayed]

APPROACH:

The technical aspects of the procedure as well as its potential
risks and benefits were reviewed with the patient. These risks
included but were not limited bleeding, infection, allergic
reaction, damage to organs/vital structures, stroke, non-diagnostic
procedure, and the catastrophic outcomes of heart attack, coma, and
death. With an understanding of these risks, informed consent was
obtained and witnessed.

The patient was placed in the supine position on the angiography
table and the skin of right groin prepped in the usual sterile
fashion. The procedure was performed under local anesthesia
(1%-solution of bicarbonate-bufferred Lidocaine) and conscious
sedation with Versed and fentanyl monitored by the in-suite nurse.

A 5- French sheath was introduced in the right common femoral artery
using Seldinger technique. A fluorophase sequence was used to
document the sheath position.

HEPARIN: 0 Units total.

CONTRAST AGENT: 80cc, Omnipaque 300

FLUOROSCOPY TIME: 5.8 combined AP and lateral minutes

CATHETER(S) AND WIRE(S):

5-French JB-1 glidecatheter

0.035" glidewire

VESSELS CATHETERIZED:

Right internal carotid

Left internal carotid

Right vertebral

Left vertebral

Right common femoral

VESSELS STUDIED:

Right internal carotid: head: AP, lateral, obliques

Right vertebral: AP, lateral, transfacial

Left internal carotid: head: AP, lateral, obliques

Left vertebral: AP, lateral

Right femoral: RAO

PROCEDURAL NARRATIVE:

A 5-Fr JB-1 terumo glide catheter was then advanced over a
glidewire into the aortic arch and the innominate and right common
carotid artery and right internal carotid artery were selected.
Cerebral angiography was performed. The JB-1 was then withdrawn into
the innominate artery and the right subclavian artery followed by
the right vertebral artery were selected. Cerebral angiography was
performed. The JB-1 was then withdrawn into the aortic arch and the
left internal carotid artery was selected. Cerebral angiography was
performed. The JB-1 catheter was then withdrawn into the aortic arch
and the left subclavian artery was selected followed by the left
vertebral artery. Cerebral angiography was performed. The JB-1
catheter was removed without incident.

INTERPRETATION:

Right internal carotid: head:

Injection reveals the presence of a widely patent ICA, M1, and A1
segments and their branches. There is no significant stenosis,
occlusion, aneurysm or high flow vascular malformation visualized.
There is mild spasm involving the distal supraclinoid internal
carotid artery, and proximal A1 segment. There is no flow
limitation. The parenchymal and venous phases are normal. The venous
sinuses are widely patent.

Left internal carotid: head:

Injection reveals the presence of a widely patent ICA, A1, and M1
segments and their branches. There is no occlusion, aneurysm, or
high flow vascular malformation visualized. There is mild vasospasm
involving the distal supraclinoid internal carotid artery, and
proximal A1 and M1 segments. There is no flow limitation. The
parenchymal and venous phases are normal. The venous sinuses are
widely patent.

Right vertebral:

Injection reveals the presence of a widely patent vertebral artery.
This leads to a widely patent basilar artery that terminates in
bilateral P1. The basilar apex is normal. There is no significant
stenosis, occlusion, aneurysm, or vascular malformation visualized.
Minimal vasospasm involving the distal basilar and primarily right
P1 is seen. There is no flow limitation. The parenchymal and venous
phases are normal. The venous sinuses are widely patent.

Left vertebral:

Normal vessel. No PICA aneurysm. See basilar description above.

Right femoral:

Normal vessel. No significant atherosclerotic disease. Arterial
sheath in adequate position.

DISPOSITION:

Upon completion of the study, the femoral sheath was removed and
hemostasis obtained using a 5-Fr ExoSeal closure device. Good
proximal and distal lower extremity pulses were documented upon
achievement of hemostasis.

The procedure was well tolerated and no early complications were
observed.

The patient was transferred back to the neuro ICU to be positioned
flat in bed for 3 hours of observation.
IMPRESSION: 1. Normal cerebral angiogram, without identified aneurysm,
arteriovenous malformation, or high flow fistula.

2. Minimal vasospasm involving the bilateral distal supraclinoid
internal carotid arteries, proximal A1 and M1 segments, and the
distal basilar and right P1. This does not cause any flow
limitation.

The preliminary results of this procedure were shared with the
patient and the patient's family.

## 2016-03-03 DIAGNOSIS — M5430 Sciatica, unspecified side: Secondary | ICD-10-CM | POA: Diagnosis not present

## 2016-03-03 DIAGNOSIS — M9903 Segmental and somatic dysfunction of lumbar region: Secondary | ICD-10-CM | POA: Diagnosis not present

## 2016-03-03 DIAGNOSIS — M9902 Segmental and somatic dysfunction of thoracic region: Secondary | ICD-10-CM | POA: Diagnosis not present

## 2016-03-12 DIAGNOSIS — M5430 Sciatica, unspecified side: Secondary | ICD-10-CM | POA: Diagnosis not present

## 2016-03-12 DIAGNOSIS — M9902 Segmental and somatic dysfunction of thoracic region: Secondary | ICD-10-CM | POA: Diagnosis not present

## 2016-03-12 DIAGNOSIS — M9903 Segmental and somatic dysfunction of lumbar region: Secondary | ICD-10-CM | POA: Diagnosis not present

## 2016-03-13 DIAGNOSIS — M5430 Sciatica, unspecified side: Secondary | ICD-10-CM | POA: Diagnosis not present

## 2016-03-13 DIAGNOSIS — M9903 Segmental and somatic dysfunction of lumbar region: Secondary | ICD-10-CM | POA: Diagnosis not present

## 2016-03-13 DIAGNOSIS — M9902 Segmental and somatic dysfunction of thoracic region: Secondary | ICD-10-CM | POA: Diagnosis not present

## 2016-03-20 DIAGNOSIS — M9902 Segmental and somatic dysfunction of thoracic region: Secondary | ICD-10-CM | POA: Diagnosis not present

## 2016-03-20 DIAGNOSIS — M5430 Sciatica, unspecified side: Secondary | ICD-10-CM | POA: Diagnosis not present

## 2016-03-20 DIAGNOSIS — M9903 Segmental and somatic dysfunction of lumbar region: Secondary | ICD-10-CM | POA: Diagnosis not present

## 2016-04-08 DIAGNOSIS — D2371 Other benign neoplasm of skin of right lower limb, including hip: Secondary | ICD-10-CM | POA: Diagnosis not present

## 2016-04-08 DIAGNOSIS — M79671 Pain in right foot: Secondary | ICD-10-CM | POA: Diagnosis not present

## 2016-04-16 DIAGNOSIS — M9903 Segmental and somatic dysfunction of lumbar region: Secondary | ICD-10-CM | POA: Diagnosis not present

## 2016-04-16 DIAGNOSIS — M9901 Segmental and somatic dysfunction of cervical region: Secondary | ICD-10-CM | POA: Diagnosis not present

## 2016-04-16 DIAGNOSIS — M62838 Other muscle spasm: Secondary | ICD-10-CM | POA: Diagnosis not present

## 2016-04-21 DIAGNOSIS — M9901 Segmental and somatic dysfunction of cervical region: Secondary | ICD-10-CM | POA: Diagnosis not present

## 2016-04-21 DIAGNOSIS — M62838 Other muscle spasm: Secondary | ICD-10-CM | POA: Diagnosis not present

## 2016-04-21 DIAGNOSIS — M9903 Segmental and somatic dysfunction of lumbar region: Secondary | ICD-10-CM | POA: Diagnosis not present

## 2016-05-07 DIAGNOSIS — M62838 Other muscle spasm: Secondary | ICD-10-CM | POA: Diagnosis not present

## 2016-05-07 DIAGNOSIS — M9901 Segmental and somatic dysfunction of cervical region: Secondary | ICD-10-CM | POA: Diagnosis not present

## 2016-05-07 DIAGNOSIS — M9903 Segmental and somatic dysfunction of lumbar region: Secondary | ICD-10-CM | POA: Diagnosis not present

## 2016-05-08 DIAGNOSIS — M9903 Segmental and somatic dysfunction of lumbar region: Secondary | ICD-10-CM | POA: Diagnosis not present

## 2016-05-08 DIAGNOSIS — M62838 Other muscle spasm: Secondary | ICD-10-CM | POA: Diagnosis not present

## 2016-05-08 DIAGNOSIS — M9901 Segmental and somatic dysfunction of cervical region: Secondary | ICD-10-CM | POA: Diagnosis not present

## 2016-05-14 DIAGNOSIS — M5137 Other intervertebral disc degeneration, lumbosacral region: Secondary | ICD-10-CM | POA: Diagnosis not present

## 2016-05-14 DIAGNOSIS — M9903 Segmental and somatic dysfunction of lumbar region: Secondary | ICD-10-CM | POA: Diagnosis not present

## 2016-05-14 DIAGNOSIS — M9902 Segmental and somatic dysfunction of thoracic region: Secondary | ICD-10-CM | POA: Diagnosis not present

## 2016-05-19 DIAGNOSIS — M9902 Segmental and somatic dysfunction of thoracic region: Secondary | ICD-10-CM | POA: Diagnosis not present

## 2016-05-19 DIAGNOSIS — M9903 Segmental and somatic dysfunction of lumbar region: Secondary | ICD-10-CM | POA: Diagnosis not present

## 2016-05-19 DIAGNOSIS — M5137 Other intervertebral disc degeneration, lumbosacral region: Secondary | ICD-10-CM | POA: Diagnosis not present

## 2016-06-02 DIAGNOSIS — M9902 Segmental and somatic dysfunction of thoracic region: Secondary | ICD-10-CM | POA: Diagnosis not present

## 2016-06-02 DIAGNOSIS — M9903 Segmental and somatic dysfunction of lumbar region: Secondary | ICD-10-CM | POA: Diagnosis not present

## 2016-06-02 DIAGNOSIS — G629 Polyneuropathy, unspecified: Secondary | ICD-10-CM | POA: Diagnosis not present

## 2016-06-02 DIAGNOSIS — M5137 Other intervertebral disc degeneration, lumbosacral region: Secondary | ICD-10-CM | POA: Diagnosis not present

## 2016-06-02 DIAGNOSIS — E785 Hyperlipidemia, unspecified: Secondary | ICD-10-CM | POA: Diagnosis not present

## 2016-06-02 DIAGNOSIS — I609 Nontraumatic subarachnoid hemorrhage, unspecified: Secondary | ICD-10-CM | POA: Diagnosis not present

## 2016-06-05 DIAGNOSIS — M9903 Segmental and somatic dysfunction of lumbar region: Secondary | ICD-10-CM | POA: Diagnosis not present

## 2016-06-05 DIAGNOSIS — M5137 Other intervertebral disc degeneration, lumbosacral region: Secondary | ICD-10-CM | POA: Diagnosis not present

## 2016-06-05 DIAGNOSIS — M9902 Segmental and somatic dysfunction of thoracic region: Secondary | ICD-10-CM | POA: Diagnosis not present

## 2016-07-23 DIAGNOSIS — W57XXXA Bitten or stung by nonvenomous insect and other nonvenomous arthropods, initial encounter: Secondary | ICD-10-CM | POA: Diagnosis not present

## 2016-07-23 DIAGNOSIS — R51 Headache: Secondary | ICD-10-CM | POA: Diagnosis not present

## 2016-07-23 DIAGNOSIS — R110 Nausea: Secondary | ICD-10-CM | POA: Diagnosis not present

## 2016-07-23 DIAGNOSIS — R5383 Other fatigue: Secondary | ICD-10-CM | POA: Diagnosis not present

## 2016-07-23 DIAGNOSIS — Z7689 Persons encountering health services in other specified circumstances: Secondary | ICD-10-CM | POA: Diagnosis not present

## 2016-08-01 DIAGNOSIS — H578 Other specified disorders of eye and adnexa: Secondary | ICD-10-CM | POA: Diagnosis not present

## 2016-08-05 DIAGNOSIS — H578 Other specified disorders of eye and adnexa: Secondary | ICD-10-CM | POA: Diagnosis not present

## 2016-08-07 DIAGNOSIS — H04123 Dry eye syndrome of bilateral lacrimal glands: Secondary | ICD-10-CM | POA: Diagnosis not present

## 2016-08-11 DIAGNOSIS — H04123 Dry eye syndrome of bilateral lacrimal glands: Secondary | ICD-10-CM | POA: Diagnosis not present

## 2016-08-26 DIAGNOSIS — M5137 Other intervertebral disc degeneration, lumbosacral region: Secondary | ICD-10-CM | POA: Diagnosis not present

## 2016-08-26 DIAGNOSIS — M9902 Segmental and somatic dysfunction of thoracic region: Secondary | ICD-10-CM | POA: Diagnosis not present

## 2016-08-26 DIAGNOSIS — M9903 Segmental and somatic dysfunction of lumbar region: Secondary | ICD-10-CM | POA: Diagnosis not present

## 2016-09-08 DIAGNOSIS — K824 Cholesterolosis of gallbladder: Secondary | ICD-10-CM | POA: Diagnosis not present

## 2016-09-08 DIAGNOSIS — M179 Osteoarthritis of knee, unspecified: Secondary | ICD-10-CM | POA: Diagnosis not present

## 2016-09-08 DIAGNOSIS — E785 Hyperlipidemia, unspecified: Secondary | ICD-10-CM | POA: Diagnosis not present

## 2016-10-11 DIAGNOSIS — H578 Other specified disorders of eye and adnexa: Secondary | ICD-10-CM | POA: Diagnosis not present

## 2016-10-30 DIAGNOSIS — M9902 Segmental and somatic dysfunction of thoracic region: Secondary | ICD-10-CM | POA: Diagnosis not present

## 2016-10-30 DIAGNOSIS — M9903 Segmental and somatic dysfunction of lumbar region: Secondary | ICD-10-CM | POA: Diagnosis not present

## 2016-10-30 DIAGNOSIS — M5137 Other intervertebral disc degeneration, lumbosacral region: Secondary | ICD-10-CM | POA: Diagnosis not present

## 2016-12-24 DIAGNOSIS — H1013 Acute atopic conjunctivitis, bilateral: Secondary | ICD-10-CM | POA: Diagnosis not present

## 2016-12-24 DIAGNOSIS — H40033 Anatomical narrow angle, bilateral: Secondary | ICD-10-CM | POA: Diagnosis not present

## 2017-01-19 DIAGNOSIS — D1800 Hemangioma unspecified site: Secondary | ICD-10-CM | POA: Diagnosis not present

## 2017-01-19 DIAGNOSIS — L821 Other seborrheic keratosis: Secondary | ICD-10-CM | POA: Diagnosis not present

## 2017-01-19 DIAGNOSIS — D225 Melanocytic nevi of trunk: Secondary | ICD-10-CM | POA: Diagnosis not present

## 2017-02-04 DIAGNOSIS — H40053 Ocular hypertension, bilateral: Secondary | ICD-10-CM | POA: Diagnosis not present

## 2017-02-04 DIAGNOSIS — H40033 Anatomical narrow angle, bilateral: Secondary | ICD-10-CM | POA: Diagnosis not present

## 2017-02-04 DIAGNOSIS — Z23 Encounter for immunization: Secondary | ICD-10-CM | POA: Diagnosis not present

## 2017-02-25 DIAGNOSIS — H40053 Ocular hypertension, bilateral: Secondary | ICD-10-CM | POA: Diagnosis not present

## 2017-02-25 DIAGNOSIS — H40033 Anatomical narrow angle, bilateral: Secondary | ICD-10-CM | POA: Diagnosis not present

## 2017-03-11 DIAGNOSIS — H40033 Anatomical narrow angle, bilateral: Secondary | ICD-10-CM | POA: Diagnosis not present

## 2017-03-11 DIAGNOSIS — H40053 Ocular hypertension, bilateral: Secondary | ICD-10-CM | POA: Diagnosis not present

## 2017-03-13 DIAGNOSIS — E785 Hyperlipidemia, unspecified: Secondary | ICD-10-CM | POA: Diagnosis not present

## 2017-03-13 DIAGNOSIS — Z0000 Encounter for general adult medical examination without abnormal findings: Secondary | ICD-10-CM | POA: Diagnosis not present

## 2017-03-13 DIAGNOSIS — Z125 Encounter for screening for malignant neoplasm of prostate: Secondary | ICD-10-CM | POA: Diagnosis not present

## 2017-03-13 DIAGNOSIS — R739 Hyperglycemia, unspecified: Secondary | ICD-10-CM | POA: Diagnosis not present

## 2017-04-16 DIAGNOSIS — M5137 Other intervertebral disc degeneration, lumbosacral region: Secondary | ICD-10-CM | POA: Diagnosis not present

## 2017-04-16 DIAGNOSIS — M9903 Segmental and somatic dysfunction of lumbar region: Secondary | ICD-10-CM | POA: Diagnosis not present

## 2017-04-16 DIAGNOSIS — M9902 Segmental and somatic dysfunction of thoracic region: Secondary | ICD-10-CM | POA: Diagnosis not present

## 2017-06-10 DIAGNOSIS — M9903 Segmental and somatic dysfunction of lumbar region: Secondary | ICD-10-CM | POA: Diagnosis not present

## 2017-06-10 DIAGNOSIS — M9902 Segmental and somatic dysfunction of thoracic region: Secondary | ICD-10-CM | POA: Diagnosis not present

## 2017-06-10 DIAGNOSIS — M5137 Other intervertebral disc degeneration, lumbosacral region: Secondary | ICD-10-CM | POA: Diagnosis not present

## 2017-06-16 DIAGNOSIS — Z91018 Allergy to other foods: Secondary | ICD-10-CM | POA: Diagnosis not present

## 2017-06-16 DIAGNOSIS — Z7689 Persons encountering health services in other specified circumstances: Secondary | ICD-10-CM | POA: Diagnosis not present

## 2017-06-16 DIAGNOSIS — W57XXXA Bitten or stung by nonvenomous insect and other nonvenomous arthropods, initial encounter: Secondary | ICD-10-CM | POA: Diagnosis not present

## 2017-06-16 DIAGNOSIS — S80869A Insect bite (nonvenomous), unspecified lower leg, initial encounter: Secondary | ICD-10-CM | POA: Diagnosis not present

## 2017-09-15 DIAGNOSIS — E785 Hyperlipidemia, unspecified: Secondary | ICD-10-CM | POA: Diagnosis not present

## 2017-09-15 DIAGNOSIS — R739 Hyperglycemia, unspecified: Secondary | ICD-10-CM | POA: Diagnosis not present

## 2017-09-15 DIAGNOSIS — K824 Cholesterolosis of gallbladder: Secondary | ICD-10-CM | POA: Diagnosis not present

## 2017-09-15 DIAGNOSIS — M179 Osteoarthritis of knee, unspecified: Secondary | ICD-10-CM | POA: Diagnosis not present

## 2017-12-25 DIAGNOSIS — Z23 Encounter for immunization: Secondary | ICD-10-CM | POA: Diagnosis not present

## 2017-12-25 DIAGNOSIS — H40053 Ocular hypertension, bilateral: Secondary | ICD-10-CM | POA: Diagnosis not present

## 2017-12-25 DIAGNOSIS — H40033 Anatomical narrow angle, bilateral: Secondary | ICD-10-CM | POA: Diagnosis not present

## 2018-01-19 DIAGNOSIS — L814 Other melanin hyperpigmentation: Secondary | ICD-10-CM | POA: Diagnosis not present

## 2018-01-19 DIAGNOSIS — L821 Other seborrheic keratosis: Secondary | ICD-10-CM | POA: Diagnosis not present

## 2018-01-19 DIAGNOSIS — D225 Melanocytic nevi of trunk: Secondary | ICD-10-CM | POA: Diagnosis not present

## 2018-03-03 DIAGNOSIS — H6981 Other specified disorders of Eustachian tube, right ear: Secondary | ICD-10-CM | POA: Diagnosis not present

## 2018-03-03 DIAGNOSIS — H60321 Hemorrhagic otitis externa, right ear: Secondary | ICD-10-CM | POA: Diagnosis not present

## 2018-03-16 DIAGNOSIS — Z125 Encounter for screening for malignant neoplasm of prostate: Secondary | ICD-10-CM | POA: Diagnosis not present

## 2018-03-16 DIAGNOSIS — E785 Hyperlipidemia, unspecified: Secondary | ICD-10-CM | POA: Diagnosis not present

## 2018-03-16 DIAGNOSIS — Z0000 Encounter for general adult medical examination without abnormal findings: Secondary | ICD-10-CM | POA: Diagnosis not present

## 2018-03-16 DIAGNOSIS — Z1159 Encounter for screening for other viral diseases: Secondary | ICD-10-CM | POA: Diagnosis not present

## 2018-03-16 DIAGNOSIS — R739 Hyperglycemia, unspecified: Secondary | ICD-10-CM | POA: Diagnosis not present

## 2018-03-19 ENCOUNTER — Other Ambulatory Visit: Payer: Self-pay | Admitting: Family Medicine

## 2018-03-19 DIAGNOSIS — K824 Cholesterolosis of gallbladder: Secondary | ICD-10-CM

## 2018-03-23 ENCOUNTER — Ambulatory Visit
Admission: RE | Admit: 2018-03-23 | Discharge: 2018-03-23 | Disposition: A | Discharge status: Home / Self Care | Payer: Commercial Managed Care - HMO | Source: Ambulatory Visit | Attending: Family Medicine | Admitting: Family Medicine

## 2018-03-23 DIAGNOSIS — K824 Cholesterolosis of gallbladder: Secondary | ICD-10-CM | POA: Diagnosis not present

## 2018-05-11 DIAGNOSIS — L820 Inflamed seborrheic keratosis: Secondary | ICD-10-CM | POA: Diagnosis not present

## 2018-12-23 ENCOUNTER — Other Ambulatory Visit: Payer: Self-pay

## 2018-12-23 DIAGNOSIS — Z20822 Contact with and (suspected) exposure to covid-19: Secondary | ICD-10-CM

## 2018-12-24 LAB — NOVEL CORONAVIRUS, NAA: SARS-CoV-2, NAA: NOT DETECTED

## 2019-12-12 ENCOUNTER — Ambulatory Visit: Payer: 59 | Attending: Orthopedic Surgery | Admitting: Physical Therapy

## 2019-12-12 ENCOUNTER — Other Ambulatory Visit: Payer: Self-pay

## 2019-12-12 DIAGNOSIS — M25562 Pain in left knee: Secondary | ICD-10-CM

## 2019-12-12 DIAGNOSIS — M25662 Stiffness of left knee, not elsewhere classified: Secondary | ICD-10-CM | POA: Insufficient documentation

## 2019-12-12 NOTE — Therapy (Signed)
[facility] [place], [place], [address] Phone: [phone]   Fax:  [phone]  Physical Therapy Evaluation  Patient Details  Name: Jacob Braun MRN: [id number] Date of Birth: Feb 11, 1960 Referring Provider (PT): Victorino December MD   Encounter Date: 12/12/2019   PT End of Session - 12/12/19 1034    Visit Number 1    Number of Visits 12    Date for PT Re-Evaluation 01/09/20    Authorization Type FOTO.    PT Start Time [phone]    PT Stop Time 1030    PT Time Calculation (min) 42 min    Activity Tolerance Patient tolerated treatment well    Behavior During Therapy WFL for tasks assessed/performed           Past Medical History:  Diagnosis Date  . Gallbladder mass   . Hemorrhoids   . Hyperlipidemia   . Medical history non-contributory   . Subarchnoid hemorrhage-brief coma [facility])     Past Surgical History:  Procedure Laterality Date  . CARPAL TUNNEL RELEASE  2008 & 2009  . CHEST SURGERY  1983   exploratory surgery   . COLONOSCOPY  10/30/2011   Procedure: COLONOSCOPY;  Surgeon: Rogene Houston, MD;  [facility];  Service: Endoscopy;  Laterality: N/A;  1030  . COLONOSCOPY N/A 08/17/2015   Procedure: COLONOSCOPY;  Surgeon: Rogene Houston, MD;  [facility];  Service: Endoscopy;  Laterality: N/A;  2:10  . KNEE CARTILAGE SURGERY  2006   Right  . RADIOLOGY WITH ANESTHESIA N/A 02/14/2014   Procedure: Aneurysm Coiling;  Surgeon: Consuella Lose, MD;  Location: [facility];  Service: Radiology;  Laterality: N/A;  . ULNAR TUNNEL RELEASE  2009   Left  . VARICOCELECTOMY  1992  . VASECTOMY  1999    There were no vitals filed for this visit.    Subjective Assessment - 12/12/19 1035    Subjective COVID-19 screen performed prior to patient entering clinic.  The patient presents to the clinic today s/p left knee arthroscopic surgery performed on 11/21/19.  He reports his pain will rise to about a 4-5/10 is he  does too much at home.  Today, he is reporting no pain.  He injured his left knee about two months ago due to a fall.    Pertinent History Right knee surgery; Ulnar and Carpal tunnel release surgeries, h/o headaches and brain bleed.    Limitations Walking    How long can you stand comfortably? Varies.    How long can you walk comfortably? 2/10 of mile.    Patient Stated Goals Get out of pain and perform ADL's like before surgery.    Currently in Pain? No/denies              [facility] PT Assessment - 12/12/19 0001      Assessment   Medical Diagnosis Left knee pain.    Referring Provider (PT) Victorino December MD    Onset Date/Surgical Date --   11/21/19 (surgery date).     Precautions   Precaution Comments PAIN-FREE LEFT LE THER EX.      Restrictions   Weight Bearing Restrictions No      Balance Screen   Has the patient fallen in the past 6 months Yes    How many times? --   3.   Has the patient had a decrease in activity level because of a fear of falling?  No    Is the patient  reluctant to leave their home because of a fear of falling?  No      Home Environment   Living Environment Private residence      Prior Function   Level of Independence Independent      Observation/Other Assessments   Focus on Therapeutic Outcomes (FOTO)  50% limitation.      Observation/Other Assessments-Edema    Edema Circumferential      Circumferential Edema   Circumferential - Right RT equal to LT.      ROM / Strength   AROM / PROM / Strength AROM;Strength      AROM   Overall AROM Comments -22 degrees of left knee extension and passive to -18 degrees with active flexion to 105 degrees.      Strength   Overall Strength Comments Notable left quadriceps atrophy and a decrease in volitional contraction of his left quadriceps      Palpation   Palpation comment Tender to palpation over the medial aspect of his left knee.      Ambulation/Gait   Gait Comments The patient is walking with is left  knee held in flexion.                      Objective measurements completed on examination: See above findings.       [facility] Adult PT Treatment/Exercise - 12/12/19 0001      Exercises   Exercises Knee/Hip      Knee/Hip Exercises: Supine   Short Arc Quad Sets Limitations 20 minutes faciliated with VMS to patient's left quadriceps with 10 sec extension holds f/b a 10 sec rest for neuro re-education.                       PT Long Term Goals - 12/12/19 1102      PT LONG TERM GOAL #1   Title Independent with a HEP.    Time 4    Period Weeks    Status New      PT LONG TERM GOAL #2   Title Full active left knee extension in order to normalize gait.    Time 4    Period Weeks    Status New      PT LONG TERM GOAL #3   Title Active knee flexion to 120 degrees+ so the patient can perform functional tasks and do so with pain not > 2-3/10.    Time 4    Period Weeks    Status New      PT LONG TERM GOAL #4   Title Increase knee left strength to a solid 4+/5 to provide good stability for accomplishment of functional activities.    Time 4    Period Weeks    Status New      PT LONG TERM GOAL #5   Title Perform a reciprocating stair gait with one railing with pain not > 2-3/10.    Time 4    Period Weeks    Status New                  Plan - 12/12/19 1055    Clinical Impression Statement The patient presents to OPPT s/p left knee arthroscopic surgery performed on 11/21/19.  He is lacking range of motion into both flexion and extension.  He was instructed in the prone hang exercise to begin at home.  His left quadriceps are significant for atrophy and a decrease in volitional contraction os this  muscle group.  His functional mobility is currently impaired.  Patient will benefit from skilled physical therapy intervention to address deficits and pain.    Personal Factors and Comorbidities Comorbidity 1;Comorbidity 2    Comorbidities Right knee surgery;  Ulnar and Carpal tunnel release surgeries, h/o headaches and brain bleed.    Examination-Activity Limitations Locomotion Level;Other    Examination-Participation Restrictions Other    Stability/Clinical Decision Making Stable/Uncomplicated    Clinical Decision Making Low    Rehab Potential Excellent    PT Frequency --   2-3 times a week.   PT Duration 4 weeks    PT Treatment/Interventions ADLs/Self Care Home Management;Cryotherapy;Electrical Stimulation;Ultrasound;Iontophoresis 4mg /ml Dexamethasone;Gait training;Stair training;Functional mobility training;Therapeutic activities;Therapeutic exercise;Neuromuscular re-education;Manual techniques;Patient/family education;Passive range of motion;Vasopneumatic Device    PT Next Visit Plan VMS to left quariceps, PROM, Nustep and progress to bike, pain-free left LE ther ex; electrical stimulation.    Consulted and Agree with Plan of Care Patient           Patient will benefit from skilled therapeutic intervention in order to improve the following deficits and impairments:  Abnormal gait, Difficulty walking, Pain, Decreased activity tolerance, Decreased strength, Decreased mobility  Visit Diagnosis: Acute pain of left knee - Plan: PT plan of care cert/re-cert  Stiffness of left knee, not elsewhere classified - Plan: PT plan of care cert/re-cert     Problem List Patient Active Problem List   Diagnosis Date Noted  . Loss of weight 08/06/2015  . Rectal discharge 08/06/2015  . Abnormal CT scan, colon 08/06/2015  . SAH (subarachnoid hemorrhage) ([place]) 02/13/2014  . Annual physical exam 12/27/2012  . Hemorrhoids   . Gallbladder mass   . Hyperlipidemia   . Pigmented nevus 03/30/2010    Kalicia Dufresne, [place] MPT 12/12/2019, 11:07 AM  [facility] [address] [place], [place], [address] Phone: [phone]   Fax:  [phone]  Name: Jacob Braun MRN: [id number] Date of Birth: 1960/08/04

## 2019-12-15 ENCOUNTER — Ambulatory Visit: Payer: 59 | Attending: Orthopedic Surgery | Admitting: *Deleted

## 2019-12-15 ENCOUNTER — Other Ambulatory Visit: Payer: Self-pay

## 2019-12-15 DIAGNOSIS — M25662 Stiffness of left knee, not elsewhere classified: Secondary | ICD-10-CM | POA: Insufficient documentation

## 2019-12-15 DIAGNOSIS — M25562 Pain in left knee: Secondary | ICD-10-CM | POA: Diagnosis present

## 2019-12-15 NOTE — Therapy (Signed)
[facility] [place], [place], [address] Phone: [phone]   Fax:  [phone]  Physical Therapy Treatment  Patient Details  Name: MANAN OLMO MRN: [id number] Date of Birth: [birth year] Referring Provider (PT): Victorino December MD   Encounter Date: 12/15/2019   PT End of Session - 12/15/19 1159    Visit Number 2    Number of Visits 12    Date for PT Re-Evaluation 01/09/20    Authorization Type FOTO.    PT Start Time 0900    PT Stop Time 0954    PT Time Calculation (min) 54 min           Past Medical History:  Diagnosis Date  . Gallbladder mass   . Hemorrhoids   . Hyperlipidemia   . Medical history non-contributory   . Subarchnoid hemorrhage-brief coma [facility])     Past Surgical History:  Procedure Laterality Date  . CARPAL TUNNEL RELEASE  2008 & 2009  . CHEST SURGERY  1983   exploratory surgery   . COLONOSCOPY  10/30/2011   Procedure: COLONOSCOPY;  Surgeon: Rogene Houston, MD;  [facility];  Service: Endoscopy;  Laterality: N/A;  1030  . COLONOSCOPY N/A 08/17/2015   Procedure: COLONOSCOPY;  Surgeon: Rogene Houston, MD;  [facility];  Service: Endoscopy;  Laterality: N/A;  2:10  . KNEE CARTILAGE SURGERY  2006   Right  . RADIOLOGY WITH ANESTHESIA N/A 02/14/2014   Procedure: Aneurysm Coiling;  Surgeon: Consuella Lose, MD;  Location: [facility];  Service: Radiology;  Laterality: N/A;  . ULNAR TUNNEL RELEASE  2009   Left  . VARICOCELECTOMY  1992  . VASECTOMY  1999    There were no vitals filed for this visit.   Subjective Assessment - 12/15/19 0919    Subjective COVID-19 screen performed prior to patient entering clinic.  The patient presents to the clinic today s/p left knee arthroscopic surgery performed on 11/21/19.  Mainly soreness LT knee    Pertinent History Right knee surgery; Ulnar and Carpal tunnel release surgeries, h/o headaches and brain bleed.    Limitations Walking    How  long can you stand comfortably? Varies.    How long can you walk comfortably? 2/10 of mile.    Patient Stated Goals Get out of pain and perform ADL's like before surgery.    Currently in Pain? Yes    Pain Score 2     Pain Location Knee    Pain Orientation Left    Pain Descriptors / Indicators Sore                             [facility] Adult PT Treatment/Exercise - 12/15/19 0001      Exercises   Exercises Knee/Hip;Ankle      Knee/Hip Exercises: Aerobic   Stationary Bike seat 10 x 12 mins      Knee/Hip Exercises: Standing   Heel Raises Both;1 set;20 reps    Heel Raises Limitations toe raises x 20    Rocker Board 4 minutes   PF/DF and calf stretch     Knee/Hip Exercises: Seated   Long Arc Quad AROM;Right;3 sets;10 reps      Knee/Hip Exercises: Supine   Short Arc Quad Sets AROM;Right   x 15 mins with VMS     Modalities   Modalities Electrical Stimulation;Vasopneumatic      Acupuncturist Location RT  VMO    Electrical Stimulation Action VMS    Electrical Stimulation Parameters 10 secs on/off x 15 mins    Electrical Stimulation Goals Tone      Manual Therapy   Manual Therapy Joint mobilization    Joint Mobilization patella mobs                       PT Long Term Goals - 12/12/19 1102      PT LONG TERM GOAL #1   Title Independent with a HEP.    Time 4    Period Weeks    Status New      PT LONG TERM GOAL #2   Title Full active left knee extension in order to normalize gait.    Time 4    Period Weeks    Status New      PT LONG TERM GOAL #3   Title Active knee flexion to 120 degrees+ so the patient can perform functional tasks and do so with pain not > 2-3/10.    Time 4    Period Weeks    Status New      PT LONG TERM GOAL #4   Title Increase knee left strength to a solid 4+/5 to provide good stability for accomplishment of functional activities.    Time 4    Period Weeks    Status New      PT LONG  TERM GOAL #5   Title Perform a reciprocating stair gait with one railing with pain not > 2-3/10.    Time 4    Period Weeks    Status New                 Plan - 12/15/19 1332    Clinical Impression Statement Pt arrived doing fairly well today with minimal pain LT knee and was able to start on the bike with partial revs and then to full. rx focused on ankle exs as well as proprioception and quad activation. VMS used for quad facilitation with good results.    Comorbidities Right knee surgery; Ulnar and Carpal tunnel release surgeries, h/o headaches and brain bleed.    Examination-Activity Limitations Locomotion Level;Other    Stability/Clinical Decision Making Stable/Uncomplicated    Rehab Potential Excellent    PT Duration 4 weeks    PT Treatment/Interventions ADLs/Self Care Home Management;Cryotherapy;Electrical Stimulation;Ultrasound;Iontophoresis 4mg /ml Dexamethasone;Gait training;Stair training;Functional mobility training;Therapeutic activities;Therapeutic exercise;Neuromuscular re-education;Manual techniques;Patient/family education;Passive range of motion;Vasopneumatic Device    PT Next Visit Plan VMS to left quariceps, PROM, Nustep and progress to bike, pain-free left LE ther ex; electrical stimulation.    Consulted and Agree with Plan of Care Patient           Patient will benefit from skilled therapeutic intervention in order to improve the following deficits and impairments:  Abnormal gait, Difficulty walking, Pain, Decreased activity tolerance, Decreased strength, Decreased mobility  Visit Diagnosis: Acute pain of left knee  Stiffness of left knee, not elsewhere classified     Problem List Patient Active Problem List   Diagnosis Date Noted  . Loss of weight 08/06/2015  . Rectal discharge 08/06/2015  . Abnormal CT scan, colon 08/06/2015  . SAH (subarachnoid hemorrhage) ([place]) 02/13/2014  . Annual physical exam 12/27/2012  . Hemorrhoids   . Gallbladder mass     . Hyperlipidemia   . Pigmented nevus 03/30/2010    Amarya Kuehl,CHRIS,PTA 12/15/2019, 1:37 PM  [facility] [address] [place], [place], [address]  Phone: [phone]   Fax:  [phone]  Name: HEARL HEIKES MRN: [id number] Date of Birth: 05/27/1960

## 2019-12-19 ENCOUNTER — Ambulatory Visit: Payer: 59 | Admitting: Neurology

## 2019-12-20 ENCOUNTER — Ambulatory Visit: Payer: 59 | Admitting: *Deleted

## 2019-12-20 ENCOUNTER — Other Ambulatory Visit: Payer: Self-pay

## 2019-12-20 DIAGNOSIS — M25662 Stiffness of left knee, not elsewhere classified: Secondary | ICD-10-CM

## 2019-12-20 DIAGNOSIS — M25562 Pain in left knee: Secondary | ICD-10-CM

## 2019-12-20 NOTE — Therapy (Signed)
[facility] [place], [place], [address] Phone: [phone]   Fax:  [phone]  Physical Therapy Treatment  Patient Details  Name: Jacob Braun MRN: [id number] Date of Birth: 07-04-1960 Referring Provider (PT): Victorino December MD   Encounter Date: 12/20/2019   PT End of Session - 12/20/19 0914    Visit Number 3    Number of Visits 12    Date for PT Re-Evaluation 01/09/20    Authorization Type FOTO.    PT Start Time 0900    PT Stop Time 0946    PT Time Calculation (min) 46 min           Past Medical History:  Diagnosis Date  . Gallbladder mass   . Hemorrhoids   . Hyperlipidemia   . Medical history non-contributory   . Subarchnoid hemorrhage-brief coma [facility])     Past Surgical History:  Procedure Laterality Date  . CARPAL TUNNEL RELEASE  2008 & 2009  . CHEST SURGERY  1983   exploratory surgery   . COLONOSCOPY  10/30/2011   Procedure: COLONOSCOPY;  Surgeon: Rogene Houston, MD;  [facility];  Service: Endoscopy;  Laterality: N/A;  1030  . COLONOSCOPY N/A 08/17/2015   Procedure: COLONOSCOPY;  Surgeon: Rogene Houston, MD;  [facility];  Service: Endoscopy;  Laterality: N/A;  2:10  . KNEE CARTILAGE SURGERY  2006   Right  . RADIOLOGY WITH ANESTHESIA N/A 02/14/2014   Procedure: Aneurysm Coiling;  Surgeon: Consuella Lose, MD;  Location: [facility];  Service: Radiology;  Laterality: N/A;  . ULNAR TUNNEL RELEASE  2009   Left  . VARICOCELECTOMY  1992  . VASECTOMY  1999    There were no vitals filed for this visit.   Subjective Assessment - 12/20/19 0913    Subjective COVID-19 screen performed prior to patient entering clinic.  .  Mainly soreness LT knee    Pertinent History Right knee surgery; Ulnar and Carpal tunnel release surgeries, h/o headaches and brain bleed.    How long can you stand comfortably? Varies.    How long can you walk comfortably? 2/10 of mile.    Patient Stated Goals Get  out of pain and perform ADL's like before surgery.    Pain Score 1     Pain Location Knee    Pain Orientation Left    Pain Descriptors / Indicators Sore                             [facility] Adult PT Treatment/Exercise - 12/20/19 0001      Exercises   Exercises Knee/Hip;Ankle      Knee/Hip Exercises: Aerobic   Stationary Bike seat 10,9,8 x 12 mins      Knee/Hip Exercises: Standing   Heel Raises Both;1 set;20 reps    Heel Raises Limitations toe raises x 20    Knee Flexion AROM;Left;3 sets;10 reps    Hip Flexion AROM;Left;3 sets;10 reps    Hip Abduction AROM;3 sets;10 reps    Rocker Board 5 minutes   PF/DF and calf stretch   Other Standing Knee Exercises one step holds x 3 each side      Knee/Hip Exercises: Seated   Long Arc Quad AROM;Right;3 sets;10 reps      Knee/Hip Exercises: Supine   Short Arc Quad Sets AROM;Right   x 15 mins with VMS     Modalities   Modalities --  Manual Therapy   Manual Therapy Joint mobilization;Passive ROM    Joint Mobilization patella mobs    Passive ROM sitting flexion stretch                       PT Long Term Goals - 12/12/19 1102      PT LONG TERM GOAL #1   Title Independent with a HEP.    Time 4    Period Weeks    Status New      PT LONG TERM GOAL #2   Title Full active left knee extension in order to normalize gait.    Time 4    Period Weeks    Status New      PT LONG TERM GOAL #3   Title Active knee flexion to 120 degrees+ so the patient can perform functional tasks and do so with pain not > 2-3/10.    Time 4    Period Weeks    Status New      PT LONG TERM GOAL #4   Title Increase knee left strength to a solid 4+/5 to provide good stability for accomplishment of functional activities.    Time 4    Period Weeks    Status New      PT LONG TERM GOAL #5   Title Perform a reciprocating stair gait with one railing with pain not > 2-3/10.    Time 4    Period Weeks    Status New                  Plan - 12/20/19 0949    Clinical Impression Statement Pt arrived today doing ffairly well and continues to progress. He was able to perform therex and balance act.'s  today with mainly mm fatigue. PROM to 115 degrees    Comorbidities Right knee surgery; Ulnar and Carpal tunnel release surgeries, h/o headaches and brain bleed.    Examination-Activity Limitations Locomotion Level;Other    Rehab Potential Excellent    PT Duration 4 weeks    PT Treatment/Interventions ADLs/Self Care Home Management;Cryotherapy;Electrical Stimulation;Ultrasound;Iontophoresis 4mg /ml Dexamethasone;Gait training;Stair training;Functional mobility training;Therapeutic activities;Therapeutic exercise;Neuromuscular re-education;Manual techniques;Patient/family education;Passive range of motion;Vasopneumatic Device    PT Next Visit Plan PROM,  bike, pain-free left LE ther ex; electrical stimulation.           Patient will benefit from skilled therapeutic intervention in order to improve the following deficits and impairments:  Abnormal gait, Difficulty walking, Pain, Decreased activity tolerance, Decreased strength, Decreased mobility  Visit Diagnosis: Acute pain of left knee  Stiffness of left knee, not elsewhere classified     Problem List Patient Active Problem List   Diagnosis Date Noted  . Loss of weight 08/06/2015  . Rectal discharge 08/06/2015  . Abnormal CT scan, colon 08/06/2015  . SAH (subarachnoid hemorrhage) ([place]) 02/13/2014  . Annual physical exam 12/27/2012  . Hemorrhoids   . Gallbladder mass   . Hyperlipidemia   . Pigmented nevus 03/30/2010    Karesa Maultsby,CHRIS, PTA 12/20/2019, 9:51 AM  [facility] [place], [place], [address] Phone: [phone]   Fax:  [phone]  Name: MARIAN GRANDT MRN: [id number] Date of Birth: 05-May-1960

## 2019-12-22 ENCOUNTER — Other Ambulatory Visit: Payer: Self-pay

## 2019-12-22 ENCOUNTER — Ambulatory Visit: Payer: 59 | Admitting: *Deleted

## 2019-12-22 DIAGNOSIS — M25662 Stiffness of left knee, not elsewhere classified: Secondary | ICD-10-CM

## 2019-12-22 DIAGNOSIS — M25562 Pain in left knee: Secondary | ICD-10-CM | POA: Diagnosis not present

## 2019-12-22 NOTE — Therapy (Signed)
[facility] [place], [place], [address] Phone: [phone]   Fax:  [phone]  Physical Therapy Treatment  Patient Details  Name: Jacob Braun MRN: [id number] Date of Birth: [birth year] Referring Provider (PT): Victorino December MD   Encounter Date: 12/22/2019   PT End of Session - 12/22/19 0924    Visit Number 4    Number of Visits 12    Date for PT Re-Evaluation 01/09/20    Authorization Type FOTO.    PT Start Time 0900    PT Stop Time 0948    PT Time Calculation (min) 48 min           Past Medical History:  Diagnosis Date  . Gallbladder mass   . Hemorrhoids   . Hyperlipidemia   . Medical history non-contributory   . Subarchnoid hemorrhage-brief coma [facility])     Past Surgical History:  Procedure Laterality Date  . CARPAL TUNNEL RELEASE  2008 & 2009  . CHEST SURGERY  1983   exploratory surgery   . COLONOSCOPY  10/30/2011   Procedure: COLONOSCOPY;  Surgeon: Rogene Houston, MD;  [facility];  Service: Endoscopy;  Laterality: N/A;  1030  . COLONOSCOPY N/A 08/17/2015   Procedure: COLONOSCOPY;  Surgeon: Rogene Houston, MD;  [facility];  Service: Endoscopy;  Laterality: N/A;  2:10  . KNEE CARTILAGE SURGERY  2006   Right  . RADIOLOGY WITH ANESTHESIA N/A 02/14/2014   Procedure: Aneurysm Coiling;  Surgeon: Consuella Lose, MD;  Location: [place];  Service: Radiology;  Laterality: N/A;  . ULNAR TUNNEL RELEASE  2009   Left  . VARICOCELECTOMY  1992  . VASECTOMY  1999    There were no vitals filed for this visit.   Subjective Assessment - 12/22/19 0922    Subjective COVID-19 screen performed prior to patient entering clinic.  .  fell off a stool yesterday, but doing ok.    Pertinent History Right knee surgery; Ulnar and Carpal tunnel release surgeries, h/o headaches and brain bleed.    Limitations Walking    How long can you stand comfortably? Varies.    How long can you walk comfortably?  2/10 of mile.    Patient Stated Goals Get out of pain and perform ADL's like before surgery.    Currently in Pain? Yes    Pain Score 2     Pain Location Knee    Pain Orientation Left    Pain Descriptors / Indicators Sore    Pain Type Surgical pain                             [facility] Adult PT Treatment/Exercise - 12/22/19 0001      Exercises   Exercises Knee/Hip;Ankle      Knee/Hip Exercises: Aerobic   Stationary Bike L2-4 seat 10,9,8,7,6 x 15 mins      Knee/Hip Exercises: Standing   Heel Raises Both;1 set;20 reps    Heel Raises Limitations toe raises x 20    Knee Flexion AROM;Left;3 sets;10 reps    Hip Flexion AROM;Left;3 sets;10 reps    Hip Abduction AROM;3 sets;10 reps    Rocker Board 5 minutes   PF/DF and calf stretch     Knee/Hip Exercises: Seated   Long Arc Quad Right;3 sets;10 reps;Strengthening    Long Arc Quad Weight 2 lbs.      Manual Therapy   Manual Therapy Joint mobilization;Passive ROM  Manual therapy comments PROM flexion was 128 degrees and mid patella was 40 cm    Joint Mobilization patella mobs    Passive ROM sitting flexion and extension stretch                       PT Long Term Goals - 12/12/19 1102      PT LONG TERM GOAL #1   Title Independent with a HEP.    Time 4    Period Weeks    Status New      PT LONG TERM GOAL #2   Title Full active left knee extension in order to normalize gait.    Time 4    Period Weeks    Status New      PT LONG TERM GOAL #3   Title Active knee flexion to 120 degrees+ so the patient can perform functional tasks and do so with pain not > 2-3/10.    Time 4    Period Weeks    Status New      PT LONG TERM GOAL #4   Title Increase knee left strength to a solid 4+/5 to provide good stability for accomplishment of functional activities.    Time 4    Period Weeks    Status New      PT LONG TERM GOAL #5   Title Perform a reciprocating stair gait with one railing with pain not >  2-3/10.    Time 4    Period Weeks    Status New                 Plan - 12/22/19 1017    Clinical Impression Statement Pt arrived today doing fair, but reports falling off a stool at home. He was able to perform all exs without increased pain and mainly fatigue end of Rx. ROM 125 degrees for flexion and -5 for extension. 40 cms mid patella. No increased swelling noted    Personal Factors and Comorbidities Comorbidity 1;Comorbidity 2    Comorbidities Right knee surgery; Ulnar and Carpal tunnel release surgeries, h/o headaches and brain bleed.    Examination-Activity Limitations Locomotion Level;Other    Rehab Potential Excellent    PT Duration 4 weeks    PT Treatment/Interventions ADLs/Self Care Home Management;Cryotherapy;Electrical Stimulation;Ultrasound;Iontophoresis 4mg /ml Dexamethasone;Gait training;Stair training;Functional mobility training;Therapeutic activities;Therapeutic exercise;Neuromuscular re-education;Manual techniques;Patient/family education;Passive range of motion;Vasopneumatic Device    PT Next Visit Plan PROM,  bike, pain-free left LE ther ex; electrical stimulation.    Consulted and Agree with Plan of Care Patient           Patient will benefit from skilled therapeutic intervention in order to improve the following deficits and impairments:  Abnormal gait,Difficulty walking,Pain,Decreased activity tolerance,Decreased strength,Decreased mobility  Visit Diagnosis: Acute pain of left knee  Stiffness of left knee, not elsewhere classified     Problem List Patient Active Problem List   Diagnosis Date Noted  . Loss of weight 08/06/2015  . Rectal discharge 08/06/2015  . Abnormal CT scan, colon 08/06/2015  . SAH (subarachnoid hemorrhage) ([place]) 02/13/2014  . Annual physical exam 12/27/2012  . Hemorrhoids   . Gallbladder mass   . Hyperlipidemia   . Pigmented nevus 03/30/2010    Arne Schlender,CHRIS , PTA 12/22/2019, 10:28 AM  [facility] [address] [place], [place], [address] Phone: [phone]   Fax:  [phone]  Name: Jacob Braun MRN: [id number] Date of Birth: 10/17/1960

## 2019-12-27 ENCOUNTER — Ambulatory Visit: Payer: 59 | Admitting: Physical Therapy

## 2019-12-27 ENCOUNTER — Other Ambulatory Visit: Payer: Self-pay

## 2019-12-27 ENCOUNTER — Encounter: Payer: Self-pay | Admitting: Physical Therapy

## 2019-12-27 DIAGNOSIS — M25562 Pain in left knee: Secondary | ICD-10-CM | POA: Diagnosis not present

## 2019-12-27 DIAGNOSIS — M25662 Stiffness of left knee, not elsewhere classified: Secondary | ICD-10-CM

## 2019-12-27 NOTE — Therapy (Signed)
[facility] [place], [place], [address] Phone: [phone]   Fax:  [phone]  Physical Therapy Treatment  Patient Details  Name: NENO HOHENSEE MRN: [id number] Date of Birth: [birth year] Referring Provider (PT): Victorino December MD   Encounter Date: 12/27/2019   PT End of Session - 12/27/19 0735    Visit Number 5    Number of Visits 12    Date for PT Re-Evaluation 01/09/20    Authorization Type FOTO.    PT Start Time 0732    PT Stop Time 0813    PT Time Calculation (min) 41 min    Activity Tolerance Patient tolerated treatment well    Behavior During Therapy WFL for tasks assessed/performed           Past Medical History:  Diagnosis Date  . Gallbladder mass   . Hemorrhoids   . Hyperlipidemia   . Medical history non-contributory   . Subarchnoid hemorrhage-brief coma     Past Surgical History:  Procedure Laterality Date  . CARPAL TUNNEL RELEASE  2008 & 2009  . CHEST SURGERY  1983   exploratory surgery   . COLONOSCOPY  10/30/2011   Procedure: COLONOSCOPY;  Surgeon: Rogene Houston, MD;  [facility];  Service: Endoscopy;  Laterality: N/A;  1030  . COLONOSCOPY N/A 08/17/2015   Procedure: COLONOSCOPY;  Surgeon: Rogene Houston, MD;  [facility];  Service: Endoscopy;  Laterality: N/A;  2:10  . KNEE CARTILAGE SURGERY  2006   Right  . RADIOLOGY WITH ANESTHESIA N/A 02/14/2014   Procedure: Aneurysm Coiling;  Surgeon: Consuella Lose, MD;  Location: [place];  Service: Radiology;  Laterality: N/A;  . ULNAR TUNNEL RELEASE  2009   Left  . VARICOCELECTOMY  1992  . VASECTOMY  1999    There were no vitals filed for this visit.   Subjective Assessment - 12/27/19 0734    Subjective COVID-19 screen performed prior to patient entering clinic. Went back to work yesterday for a few hours and reports only pain when walking.    Pertinent History Right knee surgery; Ulnar and Carpal tunnel release  surgeries, h/o headaches and brain bleed.    Limitations Walking    How long can you stand comfortably? Varies.    How long can you walk comfortably? 2/10 of mile.    Patient Stated Goals Get out of pain and perform ADL's like before surgery.    Currently in Pain? No/denies    Pain Location --    Pain Orientation --    Pain Descriptors / Indicators --    Pain Type --    Pain Onset --    Pain Frequency --              [facility] PT Assessment - 12/27/19 0001      Assessment   Medical Diagnosis Left knee pain.    Referring Provider (PT) Victorino December MD    Onset Date/Surgical Date 11/20/19    Next MD Visit 01/2020      Circumferential Edema   Circumferential - Right 41 cm   inferior patellar tendon 38 cm   Circumferential - Left  41 cm   inferior patellar tendon 39.2 cm     ROM / Strength   AROM / PROM / Strength AROM      AROM   Overall AROM  Within functional limits for tasks performed    AROM Assessment Site Knee    Right/Left Knee Left  Left Knee Extension -3    Left Knee Flexion 129                         [facility] Adult PT Treatment/Exercise - 12/27/19 0001      Knee/Hip Exercises: Stretches   Active Hamstring Stretch Left;1 rep;20 seconds      Knee/Hip Exercises: Aerobic   Stationary Bike L2, seat 5-4 x15 min      Knee/Hip Exercises: Standing   Heel Raises Both;20 reps    Heel Raises Limitations toe raises x 20    Terminal Knee Extension Strengthening;Left;20 reps;Limitations    Terminal Knee Extension Limitations [employer] XTS    Step Down Left;2 sets;10 reps;Hand Hold: 1;Step Height: 4"    Functional Squat 2 sets;10 reps;3 seconds    Rocker Board 2 minutes      Knee/Hip Exercises: Seated   Long Arc Quad Strengthening;Left;10 reps    Long Arc Quad Weight 3 lbs.    Long [employer] Limitations discomfort      Knee/Hip Exercises: Supine   Straight Leg Raises Strengthening;Left;20 reps    Straight Leg Raise with External Rotation  Strengthening;Left;10 reps      Knee/Hip Exercises: Sidelying   Hip ABduction Strengthening;Left;20 reps    Hip ADduction Strengthening;Left;20 reps    Clams LLE x20 reps      Knee/Hip Exercises: Prone   Hip Extension Strengthening;Left;20 reps                       PT Long Term Goals - 12/27/19 0815      PT LONG TERM GOAL #1   Title Independent with a HEP.    Time 4    Period Weeks    Status On-going      PT LONG TERM GOAL #2   Title Full active left knee extension in order to normalize gait.    Time 4    Period Weeks    Status On-going      PT LONG TERM GOAL #3   Title Active knee flexion to 120 degrees+ so the patient can perform functional tasks and do so with pain not > 2-3/10.    Time 4    Period Weeks    Status Achieved      PT LONG TERM GOAL #4   Title Increase knee left strength to a solid 4+/5 to provide good stability for accomplishment of functional activities.    Time 4    Period Weeks    Status On-going      PT LONG TERM GOAL #5   Title Perform a reciprocating stair gait with one railing with pain not > 2-3/10.    Time 4    Period Weeks    Status On-going                 Plan - 12/27/19 0849    Clinical Impression Statement Patient presented in clinic with reports of discomfort with walking activity. Patient did report discomfort with LAQ which was limited with reps. Deficient tone notable of L vastus medialis as well as hip adductors. More edema notable in inferior across inferior patellar tendon. Patient indicated compliance with HEP of prone hangs as well as icing at home.    Personal Factors and Comorbidities Comorbidity 1;Comorbidity 2    Comorbidities Right knee surgery; Ulnar and Carpal tunnel release surgeries, h/o headaches and brain bleed.    Examination-Activity Limitations Locomotion Level;Other  Examination-Participation Restrictions Other    Stability/Clinical Decision Making Stable/Uncomplicated    Rehab Potential  Excellent    PT Duration 4 weeks    PT Treatment/Interventions ADLs/Self Care Home Management;Cryotherapy;Electrical Stimulation;Ultrasound;Iontophoresis 4mg /ml Dexamethasone;Gait training;Stair training;Functional mobility training;Therapeutic activities;Therapeutic exercise;Neuromuscular re-education;Manual techniques;Patient/family education;Passive range of motion;Vasopneumatic Device    PT Next Visit Plan PROM,  bike, pain-free left LE ther ex; electrical stimulation.    Consulted and Agree with Plan of Care Patient           Patient will benefit from skilled therapeutic intervention in order to improve the following deficits and impairments:  Abnormal gait,Difficulty walking,Pain,Decreased activity tolerance,Decreased strength,Decreased mobility  Visit Diagnosis: Acute pain of left knee  Stiffness of left knee, not elsewhere classified     Problem List Patient Active Problem List   Diagnosis Date Noted  . Loss of weight 08/06/2015  . Rectal discharge 08/06/2015  . Abnormal CT scan, colon 08/06/2015  . SAH (subarachnoid hemorrhage) ([place]) 02/13/2014  . Annual physical exam 12/27/2012  . Hemorrhoids   . Gallbladder mass   . Hyperlipidemia   . Pigmented nevus 03/30/2010    Standley Brooking, PTA 12/27/2019, 9:13 AM  [facility] [address] [place], [place], [address] Phone: [phone]   Fax:  [phone]  Name: MATTY VANROEKEL MRN: [id number] Date of Birth: October 26, 1960

## 2019-12-29 ENCOUNTER — Other Ambulatory Visit: Payer: Self-pay

## 2019-12-29 ENCOUNTER — Ambulatory Visit: Payer: 59 | Admitting: *Deleted

## 2019-12-29 DIAGNOSIS — M25662 Stiffness of left knee, not elsewhere classified: Secondary | ICD-10-CM

## 2019-12-29 DIAGNOSIS — M25562 Pain in left knee: Secondary | ICD-10-CM

## 2019-12-29 NOTE — Therapy (Signed)
[facility] [place], [place], [address] Phone: [phone]   Fax:  [phone]  Physical Therapy Treatment  Patient Details  Name: Jacob Braun MRN: [id number] Date of Birth: 10/16/1960 Referring Provider (PT): Victorino December MD   Encounter Date: 12/29/2019   PT End of Session - 12/29/19 0951    Visit Number 6    Number of Visits 12    Date for PT Re-Evaluation 01/09/20    Authorization Type FOTO.    PT Start Time 0900    PT Stop Time 0958    PT Time Calculation (min) 58 min           Past Medical History:  Diagnosis Date  . Gallbladder mass   . Hemorrhoids   . Hyperlipidemia   . Medical history non-contributory   . Subarchnoid hemorrhage-brief coma     Past Surgical History:  Procedure Laterality Date  . CARPAL TUNNEL RELEASE  2008 & 2009  . CHEST SURGERY  1983   exploratory surgery   . COLONOSCOPY  10/30/2011   Procedure: COLONOSCOPY;  Surgeon: Rogene Houston, MD;  [facility];  Service: Endoscopy;  Laterality: N/A;  1030  . COLONOSCOPY N/A 08/17/2015   Procedure: COLONOSCOPY;  Surgeon: Rogene Houston, MD;  [facility];  Service: Endoscopy;  Laterality: N/A;  2:10  . KNEE CARTILAGE SURGERY  2006   Right  . RADIOLOGY WITH ANESTHESIA N/A 02/14/2014   Procedure: Aneurysm Coiling;  Surgeon: Consuella Lose, MD;  Location: [place];  Service: Radiology;  Laterality: N/A;  . ULNAR TUNNEL RELEASE  2009   Left  . VARICOCELECTOMY  1992  . VASECTOMY  1999    There were no vitals filed for this visit.   Subjective Assessment - 12/29/19 0908    Subjective COVID-19 screen performed prior to patient entering clinic. Went back to work on Monday for a few hours. Pain medial side when walking    Pertinent History Right knee surgery; Ulnar and Carpal tunnel release surgeries, h/o headaches and brain bleed.    Limitations Walking    How long can you stand comfortably? Varies.    How long can  you walk comfortably? 2/10 of mile.    Patient Stated Goals Get out of pain and perform ADL's like before surgery.    Currently in Pain? Yes    Pain Score 2    6/10 when walking now   Pain Location Knee    Pain Orientation Left    Pain Descriptors / Indicators Sore    Pain Type Surgical pain                             [facility] Adult PT Treatment/Exercise - 12/29/19 0001      Exercises   Exercises Knee/Hip;Ankle      Knee/Hip Exercises: Aerobic   Stationary Bike L2, seat 5-4 x15 min      Knee/Hip Exercises: Standing   Heel Raises Both;20 reps    Heel Raises Limitations toe raises x 20    Knee Flexion AROM;Left;2 sets;20 reps    Knee Flexion Limitations 2#    SLS x5 LT knee      Knee/Hip Exercises: Seated   Long Arc Quad Strengthening;Left;20 reps;2 sets   2# x 20,  3# x 20 pause at top   [employer] Weight 3 lbs.      Knee/Hip Exercises: Sidelying   Hip  ABduction Strengthening;Left;20 reps;2 sets    Hip ADduction Strengthening;Left;20 reps;2 sets                       PT Long Term Goals - 12/27/19 0815      PT LONG TERM GOAL #1   Title Independent with a HEP.    Time 4    Period Weeks    Status On-going      PT LONG TERM GOAL #2   Title Full active left knee extension in order to normalize gait.    Time 4    Period Weeks    Status On-going      PT LONG TERM GOAL #3   Title Active knee flexion to 120 degrees+ so the patient can perform functional tasks and do so with pain not > 2-3/10.    Time 4    Period Weeks    Status Achieved      PT LONG TERM GOAL #4   Title Increase knee left strength to a solid 4+/5 to provide good stability for accomplishment of functional activities.    Time 4    Period Weeks    Status On-going      PT LONG TERM GOAL #5   Title Perform a reciprocating stair gait with one railing with pain not > 2-3/10.    Time 4    Period Weeks    Status On-going                 Plan - 12/29/19 6962     Clinical Impression Statement Pt arrived today doing fair. He says after walking a while now he is getting a pain medial aspect that he rates at 6/10. Rx today focused on higher reps and ankle and hip strengthening. Pt requested vaso today before going to work.    Personal Factors and Comorbidities Comorbidity 1;Comorbidity 2    Comorbidities Right knee surgery; Ulnar and Carpal tunnel release surgeries, h/o headaches and brain bleed.    Examination-Activity Limitations Locomotion Level;Other    Examination-Participation Restrictions Other    Stability/Clinical Decision Making Stable/Uncomplicated    Rehab Potential Excellent    PT Duration 4 weeks    PT Treatment/Interventions ADLs/Self Care Home Management;Cryotherapy;Electrical Stimulation;Ultrasound;Iontophoresis 4mg /ml Dexamethasone;Gait training;Stair training;Functional mobility training;Therapeutic activities;Therapeutic exercise;Neuromuscular re-education;Manual techniques;Patient/family education;Passive range of motion;Vasopneumatic Device    PT Next Visit Plan PROM,  bike, pain-free left LE ther ex; electrical stimulation.           Patient will benefit from skilled therapeutic intervention in order to improve the following deficits and impairments:  Abnormal gait,Difficulty walking,Pain,Decreased activity tolerance,Decreased strength,Decreased mobility  Visit Diagnosis: Acute pain of left knee  Stiffness of left knee, not elsewhere classified     Problem List Patient Active Problem List   Diagnosis Date Noted  . Loss of weight 08/06/2015  . Rectal discharge 08/06/2015  . Abnormal CT scan, colon 08/06/2015  . SAH (subarachnoid hemorrhage) ([place]) 02/13/2014  . Annual physical exam 12/27/2012  . Hemorrhoids   . Gallbladder mass   . Hyperlipidemia   . Pigmented nevus 03/30/2010    Kalief Kattner,CHRIS, PTA 12/29/2019, 10:07 AM  [facility] [place], [place],  [address] Phone: [phone]   Fax:  [phone]  Name: Jacob Braun MRN: [id number] Date of Birth: [birth year]

## 2020-01-02 ENCOUNTER — Ambulatory Visit: Payer: 59 | Admitting: *Deleted

## 2020-01-02 ENCOUNTER — Other Ambulatory Visit: Payer: Self-pay

## 2020-01-02 DIAGNOSIS — M25662 Stiffness of left knee, not elsewhere classified: Secondary | ICD-10-CM

## 2020-01-02 DIAGNOSIS — M25562 Pain in left knee: Secondary | ICD-10-CM

## 2020-01-02 NOTE — Therapy (Signed)
[facility] [place], [place], [address] Phone: [phone]   Fax:  [phone]  Physical Therapy Treatment  Patient Details  Name: Jacob Braun MRN: [id number] Date of Birth: [birth year]  Referring Provider (PT): Victorino December MD   Encounter Date: 01/02/2020   PT End of Session - 01/02/20 1011    Visit Number 7    Number of Visits 12    Date for PT Re-Evaluation 01/09/20    Authorization Type FOTO.    PT Start Time 0815    PT Stop Time 0909    PT Time Calculation (min) 54 min           Past Medical History:  Diagnosis Date  . Gallbladder mass   . Hemorrhoids   . Hyperlipidemia   . Medical history non-contributory   . Subarchnoid hemorrhage-brief coma     Past Surgical History:  Procedure Laterality Date  . CARPAL TUNNEL RELEASE  2008 & 2009  . CHEST SURGERY  1983   exploratory surgery   . COLONOSCOPY  10/30/2011   Procedure: COLONOSCOPY;  Surgeon: Rogene Houston, MD;  [facility];  Service: Endoscopy;  Laterality: N/A;  1030  . COLONOSCOPY N/A 08/17/2015   Procedure: COLONOSCOPY;  Surgeon: Rogene Houston, MD;  [facility];  Service: Endoscopy;  Laterality: N/A;  2:10  . KNEE CARTILAGE SURGERY  2006   Right  . RADIOLOGY WITH ANESTHESIA N/A 02/14/2014   Procedure: Aneurysm Coiling;  Surgeon: Consuella Lose, MD;  Location: [facility];  Service: Radiology;  Laterality: N/A;  . ULNAR TUNNEL RELEASE  2009   Left  . VARICOCELECTOMY  1992  . VASECTOMY  1999    There were no vitals filed for this visit.   Subjective Assessment - 01/02/20 0832    Subjective COVID-19 screen performed prior to patient entering clinic. Went back to work on Monday for a few hours. Pain medial side when walking. Less medial paain today    Pertinent History Right knee surgery; Ulnar and Carpal tunnel release surgeries, h/o headaches and brain bleed.    Limitations Walking    How long can you stand  comfortably? Varies.    How long can you walk comfortably? 2/10 of mile.    Patient Stated Goals Get out of pain and perform ADL's like before surgery.    Currently in Pain? Yes    Pain Score 2                              [facility] Adult PT Treatment/Exercise - 01/02/20 0001      Exercises   Exercises Knee/Hip;Ankle      Knee/Hip Exercises: Aerobic   Stationary Bike L2, seat 5-4 x25min      Knee/Hip Exercises: Standing   Knee Flexion AROM;Left;2 sets;20 reps    Knee Flexion Limitations 2#    Hip Flexion Stengthening;Left;2 sets;20 reps    Hip Flexion Limitations 2#    Hip Abduction Stengthening;Left;2 sets;20 reps    Abduction Limitations 2#    Step Down Left;2 sets;Hand Hold: 1;20 reps   step outs   Rocker Board 3 minutes   PF/DF and calf stretching   SLS x5 LT knee      Vasopneumatic   Number Minutes Vasopneumatic  10 minutes    Vasopnuematic Location  Knee    Vasopneumatic Pressure Low    Vasopneumatic Temperature  34 edema  Manual Therapy   Manual Therapy Passive ROM    Manual therapy comments 5-128 degrees today    Passive ROM PROM for knee flexion and extension                       PT Long Term Goals - 12/27/19 0815      PT LONG TERM GOAL #1   Title Independent with a HEP.    Time 4    Period Weeks    Status On-going      PT LONG TERM GOAL #2   Title Full active left knee extension in order to normalize gait.    Time 4    Period Weeks    Status On-going      PT LONG TERM GOAL #3   Title Active knee flexion to 120 degrees+ so the patient can perform functional tasks and do so with pain not > 2-3/10.    Time 4    Period Weeks    Status Achieved      PT LONG TERM GOAL #4   Title Increase knee left strength to a solid 4+/5 to provide good stability for accomplishment of functional activities.    Time 4    Period Weeks    Status On-going      PT LONG TERM GOAL #5   Title Perform a reciprocating stair gait with one  railing with pain not > 2-3/10.    Time 4    Period Weeks    Status On-going                 Plan - 01/02/20 1018    Clinical Impression Statement Pt arrived reporting that he will f/u with MD tomorrow and that his knee is feeling better. He was able to continue progression of standing functional exs with pain-free motions. He feels medial joint-line pain starts after walking a while , but is getting less. AROM todat 5-128 degrees.    Comorbidities Right knee surgery; Ulnar and Carpal tunnel release surgeries, h/o headaches and brain bleed.    Rehab Potential Excellent    PT Duration 4 weeks    PT Treatment/Interventions ADLs/Self Care Home Management;Cryotherapy;Electrical Stimulation;Ultrasound;Iontophoresis 4mg /ml Dexamethasone;Gait training;Stair training;Functional mobility training;Therapeutic activities;Therapeutic exercise;Neuromuscular re-education;Manual techniques;Patient/family education;Passive range of motion;Vasopneumatic Device    PT Next Visit Plan PROM,  bike, pain-free left LE ther ex; electrical stimulation.           Patient will benefit from skilled therapeutic intervention in order to improve the following deficits and impairments:  Abnormal gait,Difficulty walking,Pain,Decreased activity tolerance,Decreased strength,Decreased mobility  Visit Diagnosis: Acute pain of left knee  Stiffness of left knee, not elsewhere classified     Problem List Patient Active Problem List   Diagnosis Date Noted  . Loss of weight 08/06/2015  . Rectal discharge 08/06/2015  . Abnormal CT scan, colon 08/06/2015  . SAH (subarachnoid hemorrhage) ([place]) 02/13/2014  . Annual physical exam 12/27/2012  . Hemorrhoids   . Gallbladder mass   . Hyperlipidemia   . Pigmented nevus 03/30/2010    Deena Shaub,CHRIS,PTA 01/02/2020, 10:23 AM  [facility] [address] [place], [place], [address] Phone: [phone]   Fax:   [phone]  Name: Jacob Braun MRN: [id number] Date of Birth: July 21, 1960

## 2020-01-03 ENCOUNTER — Ambulatory Visit: Payer: 59 | Admitting: *Deleted

## 2020-01-05 ENCOUNTER — Encounter: Payer: 59 | Admitting: Physical Therapy

## 2020-01-10 ENCOUNTER — Ambulatory Visit: Payer: 59 | Admitting: Physical Therapy

## 2020-01-10 ENCOUNTER — Other Ambulatory Visit: Payer: Self-pay

## 2020-01-10 DIAGNOSIS — M25562 Pain in left knee: Secondary | ICD-10-CM

## 2020-01-10 DIAGNOSIS — M25662 Stiffness of left knee, not elsewhere classified: Secondary | ICD-10-CM

## 2020-01-10 NOTE — Therapy (Signed)
[facility] [address] [place], [place], [address] Phone: [phone]   Fax:  [phone]  Physical Therapy Treatment  Patient Details  Name: Jacob Braun MRN: [id number] Date of Birth: [birth year] Referring Provider (PT): Duwayne Heck MD   Encounter Date: 01/10/2020   PT End of Session - 01/10/20 0933    Visit Number 8    Number of Visits 12    Date for PT Re-Evaluation 01/30/20    Authorization Type FOTO.    PT Start Time 0901    PT Stop Time 0950    PT Time Calculation (min) 49 min    Activity Tolerance Patient tolerated treatment well    Behavior During Therapy [facility] for tasks assessed/performed           Past Medical History:  Diagnosis Date  . Gallbladder mass   . Hemorrhoids   . Hyperlipidemia   . Medical history non-contributory   . Subarchnoid hemorrhage-brief coma     Past Surgical History:  Procedure Laterality Date  . CARPAL TUNNEL RELEASE  2008 & 2009  . CHEST SURGERY  1983   exploratory surgery   . COLONOSCOPY  10/30/2011   Procedure: COLONOSCOPY;  Surgeon: Malissa Hippo, MD;  [facility];  Service: Endoscopy;  Laterality: N/A;  1030  . COLONOSCOPY N/A 08/17/2015   Procedure: COLONOSCOPY;  Surgeon: Malissa Hippo, MD;  [facility];  Service: Endoscopy;  Laterality: N/A;  2:10  . KNEE CARTILAGE SURGERY  2006   Right  . RADIOLOGY WITH ANESTHESIA N/A 02/14/2014   Procedure: Aneurysm Coiling;  Surgeon: Lisbeth Renshaw, MD;  Location: [facility];  Service: Radiology;  Laterality: N/A;  . ULNAR TUNNEL RELEASE  2009   Left  . VARICOCELECTOMY  1992  . VASECTOMY  1999    There were no vitals filed for this visit.   Subjective Assessment - 01/10/20 0932    Subjective COVID-19 screen performed prior to patient entering clinic.  Patient walking a mile now with essentially no pain.    Pertinent History Right knee surgery; Ulnar and Carpal tunnel release surgeries, h/o headaches and brain  bleed.    Limitations Walking    How long can you stand comfortably? Varies.    Patient Stated Goals Get out of pain and perform ADL's like before surgery.    Currently in Pain? Yes    Pain Score 2     Pain Location Knee    Pain Orientation Left    Pain Descriptors / Indicators Sore    Pain Type Surgical pain                             [facility] Adult PT Treatment/Exercise - 01/10/20 0001      Exercises   Exercises Knee/Hip      Knee/Hip Exercises: Aerobic   Stationary Bike Level 2 x 15 minutes.      Knee/Hip Exercises: Supine   Short Arc Quad Sets Limitations 16 minutes facilitated with VMS to patient's right medial quadriceps with a 10 ec extension hold f/b a 10 sec rest.      Vasopneumatic   Number Minutes Vasopneumatic  15 minutes    Vasopnuematic Location  --   Left knee.   Vasopneumatic Pressure Low                       PT Long Term Goals - 12/27/19 1324  PT LONG TERM GOAL #1   Title Independent with a HEP.    Time 4    Period Weeks    Status On-going      PT LONG TERM GOAL #2   Title Full active left knee extension in order to normalize gait.    Time 4    Period Weeks    Status On-going      PT LONG TERM GOAL #3   Title Active knee flexion to 120 degrees+ so the patient can perform functional tasks and do so with pain not > 2-3/10.    Time 4    Period Weeks    Status Achieved      PT LONG TERM GOAL #4   Title Increase knee left strength to a solid 4+/5 to provide good stability for accomplishment of functional activities.    Time 4    Period Weeks    Status On-going      PT LONG TERM GOAL #5   Title Perform a reciprocating stair gait with one railing with pain not > 2-3/10.    Time 4    Period Weeks    Status On-going                 Plan - 01/10/20 0940    Clinical Impression Statement Patient is making excellent progress.  His left knee is actively very close to acheiving full extension.  He is  compliant wiht his HEP.  He is walking a mile without signifcant pain.    Personal Factors and Comorbidities Comorbidity 1;Comorbidity 2    Comorbidities Right knee surgery; Ulnar and Carpal tunnel release surgeries, h/o headaches and brain bleed.    Examination-Activity Limitations Locomotion Level;Other    Examination-Participation Restrictions Other    Stability/Clinical Decision Making Stable/Uncomplicated    Rehab Potential Excellent    PT Duration 4 weeks    PT Treatment/Interventions ADLs/Self Care Home Management;Cryotherapy;Electrical Stimulation;Ultrasound;Iontophoresis 4mg /ml Dexamethasone;Gait training;Stair training;Functional mobility training;Therapeutic activities;Therapeutic exercise;Neuromuscular re-education;Manual techniques;Patient/family education;Passive range of motion;Vasopneumatic Device    PT Next Visit Plan PROM,  bike, pain-free left LE ther ex; electrical stimulation.    Consulted and Agree with Plan of Care Patient           Patient will benefit from skilled therapeutic intervention in order to improve the following deficits and impairments:  Abnormal gait,Difficulty walking,Pain,Decreased activity tolerance,Decreased strength,Decreased mobility  Visit Diagnosis: Acute pain of left knee - Plan: PT plan of care cert/re-cert  Stiffness of left knee, not elsewhere classified - Plan: PT plan of care cert/re-cert     Problem List Patient Active Problem List   Diagnosis Date Noted  . Loss of weight 08/06/2015  . Rectal discharge 08/06/2015  . Abnormal CT scan, colon 08/06/2015  . SAH (subarachnoid hemorrhage) ([place]) 02/13/2014  . Annual physical exam 12/27/2012  . Hemorrhoids   . Gallbladder mass   . Hyperlipidemia   . Pigmented nevus 03/30/2010    Rashee Marschall, [place] MPT 01/10/2020, 9:50 AM  [facility] [address] [place], [place], [address] Phone: [phone]   Fax:  [phone]  Name: Jacob Braun MRN: [id number] Date of Birth: 13-Mar-1960

## 2020-01-12 ENCOUNTER — Ambulatory Visit: Payer: 59 | Admitting: *Deleted

## 2020-01-12 ENCOUNTER — Other Ambulatory Visit: Payer: Self-pay

## 2020-01-12 DIAGNOSIS — M25662 Stiffness of left knee, not elsewhere classified: Secondary | ICD-10-CM

## 2020-01-12 DIAGNOSIS — M25562 Pain in left knee: Secondary | ICD-10-CM | POA: Diagnosis not present

## 2020-01-12 NOTE — Therapy (Signed)
[facility] [address] [place], [place], [address] Phone: [phone]   Fax:  [phone]  Physical Therapy Treatment  Patient Details  Name: Jacob Braun MRN: [id number] Date of Birth: 21-Jun-1960 Referring Provider (PT): Duwayne Heck MD   Encounter Date: 01/12/2020   PT End of Session - 01/12/20 0909    Visit Number 9    Number of Visits 12    Date for PT Re-Evaluation 01/30/20    Authorization Type FOTO.    PT Start Time 0900    PT Stop Time 0951    PT Time Calculation (min) 51 min           Past Medical History:  Diagnosis Date  . Gallbladder mass   . Hemorrhoids   . Hyperlipidemia   . Medical history non-contributory   . Subarchnoid hemorrhage-brief coma     Past Surgical History:  Procedure Laterality Date  . CARPAL TUNNEL RELEASE  2008 & 2009  . CHEST SURGERY  1983   exploratory surgery   . COLONOSCOPY  10/30/2011   Procedure: COLONOSCOPY;  Surgeon: Malissa Hippo, MD;  [facility];  Service: Endoscopy;  Laterality: N/A;  1030  . COLONOSCOPY N/A 08/17/2015   Procedure: COLONOSCOPY;  Surgeon: Malissa Hippo, MD;  [facility];  Service: Endoscopy;  Laterality: N/A;  2:10  . KNEE CARTILAGE SURGERY  2006   Right  . RADIOLOGY WITH ANESTHESIA N/A 02/14/2014   Procedure: Aneurysm Coiling;  Surgeon: Lisbeth Renshaw, MD;  Location: [facility] OR;  Service: Radiology;  Laterality: N/A;  . ULNAR TUNNEL RELEASE  2009   Left  . VARICOCELECTOMY  1992  . VASECTOMY  1999    There were no vitals filed for this visit.   Subjective Assessment - 01/12/20 0908    Subjective COVID-19 screen performed prior to patient entering clinic.  Patient walking a mile now with essentially no pain. Still doing good    Pertinent History Right knee surgery; Ulnar and Carpal tunnel release surgeries, h/o headaches and brain bleed.    Limitations Walking    Patient Stated Goals Get out of pain and perform ADL's like  before surgery.    Currently in Pain? Yes    Pain Score 2     Pain Location Knee    Pain Orientation Left                             [facility] Adult PT Treatment/Exercise - 01/12/20 0001      Exercises   Exercises Knee/Hip      Knee/Hip Exercises: Aerobic   Stationary Bike Level 2-5 x 15 minutes.      Knee/Hip Exercises: Standing   Knee Flexion Strengthening;Left;3 sets;20 reps    Hip Abduction Stengthening;Left;3 sets    Abduction Limitations 3#    Rocker Board 3 minutes    SLS x5 LT knee      Knee/Hip Exercises: Seated   Long Arc Quad Strengthening;Left;1 set;20 reps   5 sec holds   Long Arc Quad Weight 3 lbs.      Vasopneumatic   Number Minutes Vasopneumatic  10 minutes    Vasopnuematic Location  Knee    Vasopneumatic Pressure Low    Vasopneumatic Temperature  34 edema/ soreness      Manual Therapy   Manual Therapy Passive ROM    Manual therapy comments 5-128 degrees today    Joint Mobilization patella mobs  Passive ROM PROM for knee flexion and extension                       PT Long Term Goals - 12/27/19 0815      PT LONG TERM GOAL #1   Title Independent with a HEP.    Time 4    Period Weeks    Status On-going      PT LONG TERM GOAL #2   Title Full active left knee extension in order to normalize gait.    Time 4    Period Weeks    Status On-going      PT LONG TERM GOAL #3   Title Active knee flexion to 120 degrees+ so the patient can perform functional tasks and do so with pain not > 2-3/10.    Time 4    Period Weeks    Status Achieved      PT LONG TERM GOAL #4   Title Increase knee left strength to a solid 4+/5 to provide good stability for accomplishment of functional activities.    Time 4    Period Weeks    Status On-going      PT LONG TERM GOAL #5   Title Perform a reciprocating stair gait with one railing with pain not > 2-3/10.    Time 4    Period Weeks    Status On-going                 Plan -  01/12/20 1651    Clinical Impression Statement Pt aarived today still doing well with minimal pain still. Rx focused on CKC as well as [place] and balance exs. SLS balance continues to improve.    Personal Factors and Comorbidities Comorbidity 1;Comorbidity 2    Comorbidities Right knee surgery; Ulnar and Carpal tunnel release surgeries, h/o headaches and brain bleed.    Stability/Clinical Decision Making Stable/Uncomplicated    PT Treatment/Interventions ADLs/Self Care Home Management;Cryotherapy;Electrical Stimulation;Ultrasound;Iontophoresis 4mg /ml Dexamethasone;Gait training;Stair training;Functional mobility training;Therapeutic activities;Therapeutic exercise;Neuromuscular re-education;Manual techniques;Patient/family education;Passive range of motion;Vasopneumatic Device    PT Next Visit Plan PROM,  bike, pain-free left LE ther ex; electrical stimulation.           Patient will benefit from skilled therapeutic intervention in order to improve the following deficits and impairments:  Abnormal gait,Difficulty walking,Pain,Decreased activity tolerance,Decreased strength,Decreased mobility  Visit Diagnosis: Acute pain of left knee  Stiffness of left knee, not elsewhere classified     Problem List Patient Active Problem List   Diagnosis Date Noted  . Loss of weight 08/06/2015  . Rectal discharge 08/06/2015  . Abnormal CT scan, colon 08/06/2015  . SAH (subarachnoid hemorrhage) ([place]) 02/13/2014  . Annual physical exam 12/27/2012  . Hemorrhoids   . Gallbladder mass   . Hyperlipidemia   . Pigmented nevus 03/30/2010    Lillymae Duet,CHRIS, PTA 01/12/2020, 4:54 PM  [facility] [address] [place], [place], [address] Phone: [phone]   Fax:  [phone]  Name: DAREN PLANO MRN: [id number] Date of Birth: Feb 25, 1960

## 2020-01-17 ENCOUNTER — Other Ambulatory Visit: Payer: Self-pay

## 2020-01-17 ENCOUNTER — Ambulatory Visit: Payer: 59 | Attending: Orthopedic Surgery | Admitting: *Deleted

## 2020-01-17 DIAGNOSIS — M25662 Stiffness of left knee, not elsewhere classified: Secondary | ICD-10-CM | POA: Insufficient documentation

## 2020-01-17 DIAGNOSIS — M25562 Pain in left knee: Secondary | ICD-10-CM | POA: Diagnosis not present

## 2020-01-17 NOTE — Therapy (Signed)
[facility] [address] [place], [place], [address] Phone: [phone]   Fax:  [phone]  Physical Therapy Treatment  Patient Details  Name: Jacob Braun MRN: [id number] Date of Birth: Aug 24, 1960 Referring Provider (PT): Duwayne Heck MD   Encounter Date: 01/17/2020   PT End of Session - 01/17/20 0923    Visit Number 10    Number of Visits 12    Date for PT Re-Evaluation 01/30/20    Authorization Type FOTO.   10th visit FOTO 33 % limitation    PT Start Time 0900    PT Stop Time 0955    PT Time Calculation (min) 55 min           Past Medical History:  Diagnosis Date  . Gallbladder mass   . Hemorrhoids   . Hyperlipidemia   . Medical history non-contributory   . Subarchnoid hemorrhage-brief coma     Past Surgical History:  Procedure Laterality Date  . CARPAL TUNNEL RELEASE  2008 & 2009  . CHEST SURGERY  1983   exploratory surgery   . COLONOSCOPY  10/30/2011   Procedure: COLONOSCOPY;  Surgeon: Malissa Hippo, MD;  [facility];  Service: Endoscopy;  Laterality: N/A;  1030  . COLONOSCOPY N/A 08/17/2015   Procedure: COLONOSCOPY;  Surgeon: Malissa Hippo, MD;  [facility];  Service: Endoscopy;  Laterality: N/A;  2:10  . KNEE CARTILAGE SURGERY  2006   Right  . RADIOLOGY WITH ANESTHESIA N/A 02/14/2014   Procedure: Aneurysm Coiling;  Surgeon: Lisbeth Renshaw, MD;  Location: [facility] OR;  Service: Radiology;  Laterality: N/A;  . ULNAR TUNNEL RELEASE  2009   Left  . VARICOCELECTOMY  1992  . VASECTOMY  1999    There were no vitals filed for this visit.   Subjective Assessment - 01/17/20 0920    Subjective COVID-19 screen performed prior to patient entering clinic.  Patient walking a mile now with essentially no pain. Soreness after being up on feet a while    Pertinent History Right knee surgery; Ulnar and Carpal tunnel release surgeries, h/o headaches and brain bleed.    Limitations Walking    How long  can you stand comfortably? Varies.    How long can you walk comfortably? 2/10 of mile.                             [facility] Adult PT Treatment/Exercise - 01/17/20 0001      Exercises   Exercises Knee/Hip      Knee/Hip Exercises: Aerobic   Stationary Bike Level 2-5 x 15 minutes.      Knee/Hip Exercises: Standing   Knee Flexion Strengthening;Left;3 sets;20 reps    Knee Flexion Limitations 3#    Hip Abduction Stengthening;Left;3 sets    Abduction Limitations 3#    Rocker Board 3 minutes    SLS x5 LT LE  on airex pad      Knee/Hip Exercises: Seated   Long Arc Quad Strengthening;Left;20 reps;3 sets   5 sec holds   Long Arc Quad Weight 3 lbs.    Sit to Sand 1 set;5 reps      Vasopneumatic   Number Minutes Vasopneumatic  10 minutes    Vasopnuematic Location  Knee    Vasopneumatic Pressure Low    Vasopneumatic Temperature  34 edema/ soreness  PT Long Term Goals - 12/27/19 0815      PT LONG TERM GOAL #1   Title Independent with a HEP.    Time 4    Period Weeks    Status On-going      PT LONG TERM GOAL #2   Title Full active left knee extension in order to normalize gait.    Time 4    Period Weeks    Status On-going      PT LONG TERM GOAL #3   Title Active knee flexion to 120 degrees+ so the patient can perform functional tasks and do so with pain not > 2-3/10.    Time 4    Period Weeks    Status Achieved      PT LONG TERM GOAL #4   Title Increase knee left strength to a solid 4+/5 to provide good stability for accomplishment of functional activities.    Time 4    Period Weeks    Status On-going      PT LONG TERM GOAL #5   Title Perform a reciprocating stair gait with one railing with pain not > 2-3/10.    Time 4    Period Weeks    Status On-going                 Plan - 01/17/20 1007    Clinical Impression Statement Pt arrived today still doing fairly well with mainly soreness LT knee, but still with  some pain at times. Rx focused on strengthening, ROM and balance. Pt able to progress to balance pad today with no pain LT knee. Try pain free box lunge. Pt to F/U Feb 1st with MD.  MD note for more visits to cont. PT until MD visit    Personal Factors and Comorbidities Comorbidity 1;Comorbidity 2    Comorbidities Right knee surgery; Ulnar and Carpal tunnel release surgeries, h/o headaches and brain bleed.    Stability/Clinical Decision Making Stable/Uncomplicated    Rehab Potential Excellent    PT Treatment/Interventions ADLs/Self Care Home Management;Cryotherapy;Electrical Stimulation;Ultrasound;Iontophoresis 4mg /ml Dexamethasone;Gait training;Stair training;Functional mobility training;Therapeutic activities;Therapeutic exercise;Neuromuscular re-education;Manual techniques;Patient/family education;Passive range of motion;Vasopneumatic Device    PT Next Visit Plan PROM,  bike, pain-free left LE ther ex; Vaso           Patient will benefit from skilled therapeutic intervention in order to improve the following deficits and impairments:  Abnormal gait,Difficulty walking,Pain,Decreased activity tolerance,Decreased strength,Decreased mobility  Visit Diagnosis: Acute pain of left knee  Stiffness of left knee, not elsewhere classified     Problem List Patient Active Problem List   Diagnosis Date Noted  . Loss of weight 08/06/2015  . Rectal discharge 08/06/2015  . Abnormal CT scan, colon 08/06/2015  . SAH (subarachnoid hemorrhage) ([place]) 02/13/2014  . Annual physical exam 12/27/2012  . Hemorrhoids   . Gallbladder mass   . Hyperlipidemia   . Pigmented nevus 03/30/2010    RAMSEUR,CHRIS, PTA 01/17/2020, 10:19 AM  [facility] [place], [place], [address] Phone: [phone]   Fax:  [phone]  Name: Jacob Braun MRN: [id number] Date of Birth: 1960-06-18

## 2020-01-19 ENCOUNTER — Other Ambulatory Visit: Payer: Self-pay

## 2020-01-19 ENCOUNTER — Ambulatory Visit: Payer: 59 | Admitting: Physical Therapy

## 2020-01-19 DIAGNOSIS — M25562 Pain in left knee: Secondary | ICD-10-CM | POA: Diagnosis not present

## 2020-01-19 DIAGNOSIS — M25662 Stiffness of left knee, not elsewhere classified: Secondary | ICD-10-CM

## 2020-01-19 NOTE — Therapy (Signed)
[facility] [place], [place], [address] Phone: [phone]   Fax:  [phone]  Physical Therapy Treatment  Patient Details  Name: Jacob Braun MRN: [id number] Date of Birth: [birth year] Referring Provider (PT): Victorino December MD   Encounter Date: 01/19/2020   PT End of Session - 01/19/20 1150    Visit Number 11    Number of Visits 18    Date for PT Re-Evaluation 02/13/20    Authorization Type FOTO.   10th visit FOTO 33 % limitation    PT Start Time 1056    PT Stop Time 1149    PT Time Calculation (min) 53 min    Activity Tolerance Patient tolerated treatment well    Behavior During Therapy WFL for tasks assessed/performed           Past Medical History:  Diagnosis Date  . Gallbladder mass   . Hemorrhoids   . Hyperlipidemia   . Medical history non-contributory   . Subarchnoid hemorrhage-brief coma     Past Surgical History:  Procedure Laterality Date  . CARPAL TUNNEL RELEASE  2008 & 2009  . CHEST SURGERY  1983   exploratory surgery   . COLONOSCOPY  10/30/2011   Procedure: COLONOSCOPY;  Surgeon: Rogene Houston, MD;  [facility];  Service: Endoscopy;  Laterality: N/A;  1030  . COLONOSCOPY N/A 08/17/2015   Procedure: COLONOSCOPY;  Surgeon: Rogene Houston, MD;  [facility];  Service: Endoscopy;  Laterality: N/A;  2:10  . KNEE CARTILAGE SURGERY  2006   Right  . RADIOLOGY WITH ANESTHESIA N/A 02/14/2014   Procedure: Aneurysm Coiling;  Surgeon: Consuella Lose, MD;  Location: [facility];  Service: Radiology;  Laterality: N/A;  . ULNAR TUNNEL RELEASE  2009   Left  . VARICOCELECTOMY  1992  . VASECTOMY  1999    There were no vitals filed for this visit.   Subjective Assessment - 01/19/20 1137    Subjective COVID-19 screen performed prior to patient entering clinic.  Knee hurt a lot yesterday after being on concrete for four hours.  Knee still hurts when I walk.  Not bad today.    Pertinent  History Right knee surgery; Ulnar and Carpal tunnel release surgeries, h/o headaches and brain bleed.    Limitations Walking    How long can you stand comfortably? Varies.    How long can you walk comfortably? 2/10 of mile.    Patient Stated Goals Get out of pain and perform ADL's like before surgery.    Currently in Pain? Yes    Pain Score 2     Pain Location Knee    Pain Orientation Left    Pain Descriptors / Indicators Sore    Pain Type Surgical pain                             [facility] Adult PT Treatment/Exercise - 01/19/20 0001      Exercises   Exercises Knee/Hip      Knee/Hip Exercises: Aerobic   Stationary Bike Level 2 to 5 x 10 minutes.    Elliptical L5/5 x 6 minutes (3 minutes forward and 3 minutes backward).      Electrical Stimulation   Electrical Stimulation Location Left medial knee.    Electrical Stimulation Action Pre-mod.    Electrical Stimulation Parameters 80-150 Hz x 20 minutes.    Electrical Stimulation Goals Pain  Vasopneumatic   Number Minutes Vasopneumatic  20 minutes    Vasopnuematic Location  --   Left knee.   Vasopneumatic Pressure Low      Manual Therapy   Passive ROM In supine, prone hang position while receiving STW/M x 8 minutes to patient's left hamstrings                       PT Long Term Goals - 12/27/19 0815      PT LONG TERM GOAL #1   Title Independent with a HEP.    Time 4    Period Weeks    Status On-going      PT LONG TERM GOAL #2   Title Full active left knee extension in order to normalize gait.    Time 4    Period Weeks    Status On-going      PT LONG TERM GOAL #3   Title Active knee flexion to 120 degrees+ so the patient can perform functional tasks and do so with pain not > 2-3/10.    Time 4    Period Weeks    Status Achieved      PT LONG TERM GOAL #4   Title Increase knee left strength to a solid 4+/5 to provide good stability for accomplishment of functional activities.    Time 4     Period Weeks    Status On-going      PT LONG TERM GOAL #5   Title Perform a reciprocating stair gait with one railing with pain not > 2-3/10.    Time 4    Period Weeks    Status On-going                 Plan - 01/19/20 1152    Clinical Impression Statement Patient on concrete yesteday at work for four hours and c/o left medial joint line pain.  He states this is where it hurts when he walks alot.  He did very well today with the addition of the elliptical.    Personal Factors and Comorbidities Comorbidity 1;Comorbidity 2    Comorbidities Right knee surgery; Ulnar and Carpal tunnel release surgeries, h/o headaches and brain bleed.    Examination-Activity Limitations Locomotion Level;Other    Examination-Participation Restrictions Other    Stability/Clinical Decision Making Stable/Uncomplicated    Rehab Potential Excellent    PT Duration 4 weeks    PT Treatment/Interventions ADLs/Self Care Home Management;Cryotherapy;Electrical Stimulation;Ultrasound;Iontophoresis 4mg /ml Dexamethasone;Gait training;Stair training;Functional mobility training;Therapeutic activities;Therapeutic exercise;Neuromuscular re-education;Manual techniques;Patient/family education;Passive range of motion;Vasopneumatic Device    PT Next Visit Plan PROM,  bike, pain-free left LE ther ex; Vaso    Consulted and Agree with Plan of Care Patient           Patient will benefit from skilled therapeutic intervention in order to improve the following deficits and impairments:  Abnormal gait,Difficulty walking,Pain,Decreased activity tolerance,Decreased strength,Decreased mobility  Visit Diagnosis: Acute pain of left knee  Stiffness of left knee, not elsewhere classified     Problem List Patient Active Problem List   Diagnosis Date Noted  . Loss of weight 08/06/2015  . Rectal discharge 08/06/2015  . Abnormal CT scan, colon 08/06/2015  . SAH (subarachnoid hemorrhage) ([facility]) 02/13/2014  . Annual physical  exam 12/27/2012  . Hemorrhoids   . Gallbladder mass   . Hyperlipidemia   . Pigmented nevus 03/30/2010    Lene Mckay, 04/01/2010 MPT 01/19/2020, 11:53 AM  [facility] [address] [place],  [place], [address] Phone: [phone]   Fax:  [phone]  Name: Jacob Braun MRN: [id number] Date of Birth: 03-24-1960

## 2020-01-24 ENCOUNTER — Other Ambulatory Visit: Payer: Self-pay

## 2020-01-24 ENCOUNTER — Ambulatory Visit: Payer: 59 | Admitting: *Deleted

## 2020-01-24 DIAGNOSIS — M25562 Pain in left knee: Secondary | ICD-10-CM | POA: Diagnosis not present

## 2020-01-24 DIAGNOSIS — M25662 Stiffness of left knee, not elsewhere classified: Secondary | ICD-10-CM

## 2020-01-24 NOTE — Therapy (Signed)
[facility] [place], [place], [address] Phone: [phone]   Fax:  [phone]  Physical Therapy Treatment  Patient Details  Name: Jacob Braun MRN: [id number] Date of Birth: 01-22-1960 Referring Provider (PT): Victorino December MD   Encounter Date: 01/24/2020   PT End of Session - 01/24/20 1000    Visit Number 12    Number of Visits 18    Date for PT Re-Evaluation 02/13/20    Authorization Type FOTO.   10th visit FOTO 33 % limitation    PT Start Time 0901    PT Stop Time 0951    PT Time Calculation (min) 50 min    Activity Tolerance Patient tolerated treatment well    Behavior During Therapy [facility] for tasks assessed/performed           Past Medical History:  Diagnosis Date  . Gallbladder mass   . Hemorrhoids   . Hyperlipidemia   . Medical history non-contributory   . Subarchnoid hemorrhage-brief coma     Past Surgical History:  Procedure Laterality Date  . CARPAL TUNNEL RELEASE  2008 & 2009  . CHEST SURGERY  1983   exploratory surgery   . COLONOSCOPY  10/30/2011   Procedure: COLONOSCOPY;  Surgeon: Rogene Houston, MD;  [facility];  Service: Endoscopy;  Laterality: N/A;  1030  . COLONOSCOPY N/A 08/17/2015   Procedure: COLONOSCOPY;  Surgeon: Rogene Houston, MD;  [facility];  Service: Endoscopy;  Laterality: N/A;  2:10  . KNEE CARTILAGE SURGERY  2006   Right  . RADIOLOGY WITH ANESTHESIA N/A 02/14/2014   Procedure: Aneurysm Coiling;  Surgeon: Consuella Lose, MD;  Location: [facility];  Service: Radiology;  Laterality: N/A;  . ULNAR TUNNEL RELEASE  2009   Left  . VARICOCELECTOMY  1992  . VASECTOMY  1999    There were no vitals filed for this visit.   Subjective Assessment - 01/24/20 0908    Subjective COVID-19 screen performed prior to patient entering clinic. My LT knee started swelling  over the weekend. Feels tight.    Pertinent History Right knee surgery; Ulnar and Carpal tunnel  release surgeries, h/o headaches and brain bleed.    Limitations Walking    How long can you stand comfortably? Varies.    How long can you walk comfortably? 2/10 of mile.    Patient Stated Goals Get out of pain and perform ADL's like before surgery.    Currently in Pain? Yes    Pain Score 3     Pain Location Knee    Pain Orientation Left    Pain Descriptors / Indicators Sore                             [facility] Adult PT Treatment/Exercise - 01/24/20 0001      Exercises   Exercises Knee/Hip      Knee/Hip Exercises: Aerobic   Stationary Bike Level 2 to 5 x 12 minutes.      Knee/Hip Exercises: Standing   Heel Raises Both;2 sets;20 reps    Heel Raises Limitations Toe raises 2x20 leaning back to wall    Knee Flexion Strengthening;Left;3 sets;20 reps    Knee Flexion Limitations 3#    Hip Abduction Stengthening;Left;3 sets;20 reps    Abduction Limitations 3#    Rocker Board 3 minutes    SLS x5 LT LE  on airex pad  Knee/Hip Exercises: Seated   Long Arc Quad Strengthening;Left;20 reps;3 sets    [employer] Weight 3 lbs.      Vasopneumatic   Number Minutes Vasopneumatic  15 minutes    Vasopnuematic Location  --   Left knee.   Vasopneumatic Pressure Low    Vasopneumatic Temperature  34 edema/ soreness                       PT Long Term Goals - 12/27/19 0815      PT LONG TERM GOAL #1   Title Independent with a HEP.    Time 4    Period Weeks    Status On-going      PT LONG TERM GOAL #2   Title Full active left knee extension in order to normalize gait.    Time 4    Period Weeks    Status On-going      PT LONG TERM GOAL #3   Title Active knee flexion to 120 degrees+ so the patient can perform functional tasks and do so with pain not > 2-3/10.    Time 4    Period Weeks    Status Achieved      PT LONG TERM GOAL #4   Title Increase knee left strength to a solid 4+/5 to provide good stability for accomplishment of functional activities.     Time 4    Period Weeks    Status On-going      PT LONG TERM GOAL #5   Title Perform a reciprocating stair gait with one railing with pain not > 2-3/10.    Time 4    Period Weeks    Status On-going                 Plan - 01/24/20 1002    Clinical Impression Statement Pt arrived today doing fair , but c/o swelling and tightness that started this weekend. He did well with Therex with no increased pain. No step downs performed today due to swelling. Good response with Vaso.    Comorbidities Right knee surgery; Ulnar and Carpal tunnel release surgeries, h/o headaches and brain bleed.    Examination-Activity Limitations Locomotion Level;Other    Stability/Clinical Decision Making Stable/Uncomplicated    Rehab Potential Excellent    PT Duration 4 weeks    PT Treatment/Interventions ADLs/Self Care Home Management;Cryotherapy;Electrical Stimulation;Ultrasound;Iontophoresis 4mg /ml Dexamethasone;Gait training;Stair training;Functional mobility training;Therapeutic activities;Therapeutic exercise;Neuromuscular re-education;Manual techniques;Patient/family education;Passive range of motion;Vasopneumatic Device    PT Next Visit Plan PROM,  bike, pain-free left LE ther ex; Vaso    Consulted and Agree with Plan of Care Patient           Patient will benefit from skilled therapeutic intervention in order to improve the following deficits and impairments:  Abnormal gait,Difficulty walking,Pain,Decreased activity tolerance,Decreased strength,Decreased mobility  Visit Diagnosis: Acute pain of left knee  Stiffness of left knee, not elsewhere classified     Problem List Patient Active Problem List   Diagnosis Date Noted  . Loss of weight 08/06/2015  . Rectal discharge 08/06/2015  . Abnormal CT scan, colon 08/06/2015  . SAH (subarachnoid hemorrhage) ([facility]) 02/13/2014  . Annual physical exam 12/27/2012  . Hemorrhoids   . Gallbladder mass   . Hyperlipidemia   . Pigmented nevus  03/30/2010    RAMSEUR,CHRIS, PTA 01/24/2020, 10:06 AM  [facility] [place], [place], [address] Phone: [phone]   Fax:  [phone]  Name: Jacob Schroader  Braun MRN: [id number] Date of Birth: 1960/07/20

## 2020-01-26 ENCOUNTER — Ambulatory Visit: Payer: 59 | Admitting: *Deleted

## 2020-01-26 ENCOUNTER — Other Ambulatory Visit: Payer: Self-pay

## 2020-01-26 DIAGNOSIS — M25562 Pain in left knee: Secondary | ICD-10-CM | POA: Diagnosis not present

## 2020-01-26 DIAGNOSIS — M25662 Stiffness of left knee, not elsewhere classified: Secondary | ICD-10-CM

## 2020-01-26 NOTE — Therapy (Signed)
[facility] [place], [place], [address] Phone: [phone]   Fax:  [phone]  Physical Therapy Treatment  Patient Details  Name: Jacob Braun MRN: [id number] Date of Birth: [birth year] Referring Provider (PT): Victorino December MD   Encounter Date: 01/26/2020   PT End of Session - 01/26/20 0911    Visit Number 13    Number of Visits 18    Date for PT Re-Evaluation 02/13/20    Authorization Type FOTO.   10th visit FOTO 33 % limitation    PT Start Time 0903    PT Stop Time 0953    PT Time Calculation (min) 50 min    Activity Tolerance Patient tolerated treatment well    Behavior During Therapy [facility] for tasks assessed/performed           Past Medical History:  Diagnosis Date  . Gallbladder mass   . Hemorrhoids   . Hyperlipidemia   . Medical history non-contributory   . Subarchnoid hemorrhage-brief coma     Past Surgical History:  Procedure Laterality Date  . CARPAL TUNNEL RELEASE  2008 & 2009  . CHEST SURGERY  1983   exploratory surgery   . COLONOSCOPY  10/30/2011   Procedure: COLONOSCOPY;  Surgeon: Rogene Houston, MD;  [facility];  Service: Endoscopy;  Laterality: N/A;  1030  . COLONOSCOPY N/A 08/17/2015   Procedure: COLONOSCOPY;  Surgeon: Rogene Houston, MD;  [facility];  Service: Endoscopy;  Laterality: N/A;  2:10  . KNEE CARTILAGE SURGERY  2006   Right  . RADIOLOGY WITH ANESTHESIA N/A 02/14/2014   Procedure: Aneurysm Coiling;  Surgeon: Consuella Lose, MD;  Location: [facility];  Service: Radiology;  Laterality: N/A;  . ULNAR TUNNEL RELEASE  2009   Left  . VARICOCELECTOMY  1992  . VASECTOMY  1999    There were no vitals filed for this visit.   Subjective Assessment - 01/26/20 0911    Subjective COVID-19 screen performed prior to patient entering clinic. My LT knee started swelling  over the weekend. Feels tight.    Pertinent History Right knee surgery; Ulnar and Carpal tunnel  release surgeries, h/o headaches and brain bleed.    Limitations Walking    How long can you walk comfortably? 2/10 of mile.    Patient Stated Goals Get out of pain and perform ADL's like before surgery.    Currently in Pain? Yes    Pain Score 2     Pain Location Knee    Pain Orientation Left    Pain Descriptors / Indicators Sore                             [facility] Adult PT Treatment/Exercise - 01/26/20 0001      Exercises   Exercises Knee/Hip      Knee/Hip Exercises: Aerobic   Stationary Bike Level 2 to 5 x 12 minutes.      Knee/Hip Exercises: Standing   Heel Raises Both;2 sets;20 reps    Heel Raises Limitations Toe raises 2x20 leaning back to wall    Knee Flexion Strengthening;Left;3 sets;20 reps    Knee Flexion Limitations 3#    Hip Abduction Stengthening;Left;3 sets;20 reps    Abduction Limitations 3#    Rocker Board 3 minutes    SLS x5 LT LE  on airex pad      Knee/Hip Exercises: Seated   Long Arc Quad Strengthening;Left;20 reps;3  sets    [employer] Weight 4 lbs.      Vasopneumatic   Number Minutes Vasopneumatic  15 minutes    Vasopnuematic Location  Knee    Vasopneumatic Pressure Low    Vasopneumatic Temperature  34 edema/ soreness                       PT Long Term Goals - 12/27/19 0815      PT LONG TERM GOAL #1   Title Independent with a HEP.    Time 4    Period Weeks    Status On-going      PT LONG TERM GOAL #2   Title Full active left knee extension in order to normalize gait.    Time 4    Period Weeks    Status On-going      PT LONG TERM GOAL #3   Title Active knee flexion to 120 degrees+ so the patient can perform functional tasks and do so with pain not > 2-3/10.    Time 4    Period Weeks    Status Achieved      PT LONG TERM GOAL #4   Title Increase knee left strength to a solid 4+/5 to provide good stability for accomplishment of functional activities.    Time 4    Period Weeks    Status On-going      PT  LONG TERM GOAL #5   Title Perform a reciprocating stair gait with one railing with pain not > 2-3/10.    Time 4    Period Weeks    Status On-going                 Plan - 01/26/20 0913    Clinical Impression Statement Pt arrived today doing better with less pain. He was able to resume exs and progress in some. AROM today was 128 degrees today.    Comorbidities Right knee surgery; Ulnar and Carpal tunnel release surgeries, h/o headaches and brain bleed.    Stability/Clinical Decision Making Stable/Uncomplicated    Rehab Potential Excellent    PT Treatment/Interventions ADLs/Self Care Home Management;Cryotherapy;Electrical Stimulation;Ultrasound;Iontophoresis 4mg /ml Dexamethasone;Gait training;Stair training;Functional mobility training;Therapeutic activities;Therapeutic exercise;Neuromuscular re-education;Manual techniques;Patient/family education;Passive range of motion;Vasopneumatic Device    PT Next Visit Plan PROM,  bike, pain-free left LE ther ex; Vaso           Patient will benefit from skilled therapeutic intervention in order to improve the following deficits and impairments:  Abnormal gait,Difficulty walking,Pain,Decreased activity tolerance,Decreased strength,Decreased mobility  Visit Diagnosis: Acute pain of left knee  Stiffness of left knee, not elsewhere classified     Problem List Patient Active Problem List   Diagnosis Date Noted  . Loss of weight 08/06/2015  . Rectal discharge 08/06/2015  . Abnormal CT scan, colon 08/06/2015  . SAH (subarachnoid hemorrhage) ([place]) 02/13/2014  . Annual physical exam 12/27/2012  . Hemorrhoids   . Gallbladder mass   . Hyperlipidemia   . Pigmented nevus 03/30/2010    Jenesa Foresta,CHRIS, PTA 01/26/2020, 10:02 AM  [facility] [place], [place], [address] Phone: [phone]   Fax:  [phone]  Name: KELVYN SCHUNK MRN: [id number] Date of Birth: 10-13-1960

## 2020-01-31 ENCOUNTER — Other Ambulatory Visit: Payer: Self-pay

## 2020-01-31 ENCOUNTER — Ambulatory Visit: Payer: 59 | Admitting: *Deleted

## 2020-01-31 DIAGNOSIS — M25562 Pain in left knee: Secondary | ICD-10-CM | POA: Diagnosis not present

## 2020-01-31 DIAGNOSIS — M25662 Stiffness of left knee, not elsewhere classified: Secondary | ICD-10-CM

## 2020-01-31 NOTE — Therapy (Signed)
[facility] [place], [place], [address] Phone: [phone]   Fax:  [phone]  Physical Therapy Treatment  Patient Details  Name: Jacob Braun MRN: [id number] Date of Birth: January 13, 1961 Referring Provider (PT): Victorino December MD   Encounter Date: 01/31/2020   PT End of Session - 01/31/20 0917    Visit Number 14    Number of Visits 18    Date for PT Re-Evaluation 02/13/20    Authorization Type FOTO.   10th visit FOTO 33 % limitation    PT Start Time 0910    PT Stop Time 0959    PT Time Calculation (min) 49 min           Past Medical History:  Diagnosis Date  . Gallbladder mass   . Hemorrhoids   . Hyperlipidemia   . Medical history non-contributory   . Subarchnoid hemorrhage-brief coma     Past Surgical History:  Procedure Laterality Date  . CARPAL TUNNEL RELEASE  2008 & 2009  . CHEST SURGERY  1983   exploratory surgery   . COLONOSCOPY  10/30/2011   Procedure: COLONOSCOPY;  Surgeon: Rogene Houston, MD;  [facility];  Service: Endoscopy;  Laterality: N/A;  1030  . COLONOSCOPY N/A 08/17/2015   Procedure: COLONOSCOPY;  Surgeon: Rogene Houston, MD;  [facility];  Service: Endoscopy;  Laterality: N/A;  2:10  . KNEE CARTILAGE SURGERY  2006   Right  . RADIOLOGY WITH ANESTHESIA N/A 02/14/2014   Procedure: Aneurysm Coiling;  Surgeon: Consuella Lose, MD;  Location: [facility];  Service: Radiology;  Laterality: N/A;  . ULNAR TUNNEL RELEASE  2009   Left  . VARICOCELECTOMY  1992  . VASECTOMY  1999    There were no vitals filed for this visit.   Subjective Assessment - 01/31/20 0914    Subjective COVID-19 screen performed prior to patient entering clinic. My LT knee is aching on the inside area again. 5/10. Fell this morning on the ice    Pertinent History Right knee surgery; Ulnar and Carpal tunnel release surgeries, h/o headaches and brain bleed.    Limitations Walking    How long can you  stand comfortably? Varies.    How long can you walk comfortably? 2/10 of mile.    Patient Stated Goals Get out of pain and perform ADL's like before surgery.    Currently in Pain? Yes    Pain Score 5     Pain Location Knee    Pain Orientation Left    Pain Descriptors / Indicators Aching;Sore                             [facility] Adult PT Treatment/Exercise - 01/31/20 0001      Exercises   Exercises Knee/Hip      Knee/Hip Exercises: Aerobic   Stationary Bike Level 2 to 5 x 12 minutes.      Knee/Hip Exercises: Standing   Heel Raises Both;2 sets;20 reps    Heel Raises Limitations Toe raises 2x20 leaning back to wall    Knee Flexion Strengthening;Left;3 sets;20 reps    Knee Flexion Limitations 3#    Hip Abduction Stengthening;Left;3 sets;20 reps    Abduction Limitations 3#    Rocker Board 3 minutes    SLS x5 LT LE  on airex pad      Knee/Hip Exercises: Seated   Long Arc Quad Strengthening;Left;20 reps;3 sets  Long Arc Quad Weight 4 lbs.      Vasopneumatic   Number Minutes Vasopneumatic  15 minutes    Vasopnuematic Location  Knee    Vasopneumatic Pressure Low    Vasopneumatic Temperature  34 edema/ soreness                       PT Long Term Goals - 12/27/19 0815      PT LONG TERM GOAL #1   Title Independent with a HEP.    Time 4    Period Weeks    Status On-going      PT LONG TERM GOAL #2   Title Full active left knee extension in order to normalize gait.    Time 4    Period Weeks    Status On-going      PT LONG TERM GOAL #3   Title Active knee flexion to 120 degrees+ so the patient can perform functional tasks and do so with pain not > 2-3/10.    Time 4    Period Weeks    Status Achieved      PT LONG TERM GOAL #4   Title Increase knee left strength to a solid 4+/5 to provide good stability for accomplishment of functional activities.    Time 4    Period Weeks    Status On-going      PT LONG TERM GOAL #5   Title Perform a  reciprocating stair gait with one railing with pain not > 2-3/10.    Time 4    Period Weeks    Status On-going                 Plan - 01/31/20 6294    Clinical Impression Statement Pt arrived today with some complaints of increased ache in LT knee jt line. He was still able to perform all exs without increased pain and felt good end of session.    Examination-Participation Restrictions Other    Rehab Potential Excellent    PT Duration 4 weeks    PT Treatment/Interventions ADLs/Self Care Home Management;Cryotherapy;Electrical Stimulation;Ultrasound;Iontophoresis 4mg /ml Dexamethasone;Gait training;Stair training;Functional mobility training;Therapeutic activities;Therapeutic exercise;Neuromuscular re-education;Manual techniques;Patient/family education;Passive range of motion;Vasopneumatic Device    PT Next Visit Plan PROM,  bike, pain-free left LE ther ex; Vaso    Consulted and Agree with Plan of Care Patient           Patient will benefit from skilled therapeutic intervention in order to improve the following deficits and impairments:  Abnormal gait,Difficulty walking,Pain,Decreased activity tolerance,Decreased strength,Decreased mobility  Visit Diagnosis: Acute pain of left knee  Stiffness of left knee, not elsewhere classified     Problem List Patient Active Problem List   Diagnosis Date Noted  . Loss of weight 08/06/2015  . Rectal discharge 08/06/2015  . Abnormal CT scan, colon 08/06/2015  . SAH (subarachnoid hemorrhage) ([place]) 02/13/2014  . Annual physical exam 12/27/2012  . Hemorrhoids   . Gallbladder mass   . Hyperlipidemia   . Pigmented nevus 03/30/2010    Laurali Goddard,CHRIS, PTA 01/31/2020, 10:01 AM  [facility] [place], [place], [address] Phone: [phone]   Fax:  [phone]  Name: Jacob Braun MRN: [id number] Date of Birth: 1960/11/24

## 2020-02-02 ENCOUNTER — Other Ambulatory Visit: Payer: Self-pay

## 2020-02-02 ENCOUNTER — Ambulatory Visit: Payer: 59 | Admitting: *Deleted

## 2020-02-02 DIAGNOSIS — M25562 Pain in left knee: Secondary | ICD-10-CM

## 2020-02-02 DIAGNOSIS — M25662 Stiffness of left knee, not elsewhere classified: Secondary | ICD-10-CM

## 2020-02-02 NOTE — Therapy (Signed)
[facility] [address] [place], [place], [address] Phone: [phone]   Fax:  [phone]  Physical Therapy Treatment  Patient Details  Name: Jacob Braun MRN: [id number] Date of Birth: 1960-01-31 Referring Provider (PT): Duwayne Heck MD   Encounter Date: 02/02/2020   PT End of Session - 02/02/20 0901    Visit Number 15    Number of Visits 18    Date for PT Re-Evaluation 02/13/20    Authorization Type FOTO.   10th visit FOTO 33 % limitation    PT Start Time 0900    PT Stop Time 0950    PT Time Calculation (min) 50 min           Past Medical History:  Diagnosis Date  . Gallbladder mass   . Hemorrhoids   . Hyperlipidemia   . Medical history non-contributory   . Subarchnoid hemorrhage-brief coma     Past Surgical History:  Procedure Laterality Date  . CARPAL TUNNEL RELEASE  2008 & 2009  . CHEST SURGERY  1983   exploratory surgery   . COLONOSCOPY  10/30/2011   Procedure: COLONOSCOPY;  Surgeon: Malissa Hippo, MD;  [facility];  Service: Endoscopy;  Laterality: N/A;  1030  . COLONOSCOPY N/A 08/17/2015   Procedure: COLONOSCOPY;  Surgeon: Malissa Hippo, MD;  [facility];  Service: Endoscopy;  Laterality: N/A;  2:10  . KNEE CARTILAGE SURGERY  2006   Right  . RADIOLOGY WITH ANESTHESIA N/A 02/14/2014   Procedure: Aneurysm Coiling;  Surgeon: Lisbeth Renshaw, MD;  Location: [facility] OR;  Service: Radiology;  Laterality: N/A;  . ULNAR TUNNEL RELEASE  2009   Left  . VARICOCELECTOMY  1992  . VASECTOMY  1999    There were no vitals filed for this visit.   Subjective Assessment - 02/02/20 0903    Subjective COVID-19 screen performed prior to patient entering clinic. My LT knee aching on the inside area 2/10.  Did good after last RX    Pertinent History Right knee surgery; Ulnar and Carpal tunnel release surgeries, h/o headaches and brain bleed.    How long can you stand comfortably? Varies.    How long  can you walk comfortably? 2/10 of mile.    Patient Stated Goals Get out of pain and perform ADL's like before surgery.    Currently in Pain? Yes    Pain Score 2     Pain Location Knee    Pain Orientation Left    Pain Descriptors / Indicators Aching;Sore    Pain Type Surgical pain                             [facility] Adult PT Treatment/Exercise - 02/02/20 0001      Exercises   Exercises Knee/Hip      Knee/Hip Exercises: Aerobic   Stationary Bike Level 2 to 5 x 12 minutes.      Knee/Hip Exercises: Standing   Heel Raises Both;20 reps;3 sets    Heel Raises Limitations Toe raises 3x20 leaning back to wall    Knee Flexion Strengthening;Left;3 sets;20 reps    Knee Flexion Limitations 3#    Hip Abduction Stengthening;Left;3 sets;20 reps    Abduction Limitations 3#    Rocker Board 3 minutes    SLS x5 LT LE  on airex pad      Knee/Hip Exercises: Seated   Long Arc Quad Strengthening;Left;20 reps;3 sets  Long Arc Quad Weight 5 lbs.      Vasopneumatic   Number Minutes Vasopneumatic  10 minutes    Vasopnuematic Location  Knee    Vasopneumatic Pressure Low    Vasopneumatic Temperature  34 edema/ soreness      Manual Therapy   Manual Therapy Passive ROM    Manual therapy comments AROM 5-128 degrees today, PROM 130 degrees    Passive ROM In supine, prone hang position while receiving STW/M x 8 minutes to patient's left hamstrings                       PT Long Term Goals - 12/27/19 0815      PT LONG TERM GOAL #1   Title Independent with a HEP.    Time 4    Period Weeks    Status On-going      PT LONG TERM GOAL #2   Title Full active left knee extension in order to normalize gait.    Time 4    Period Weeks    Status On-going      PT LONG TERM GOAL #3   Title Active knee flexion to 120 degrees+ so the patient can perform functional tasks and do so with pain not > 2-3/10.    Time 4    Period Weeks    Status Achieved      PT LONG TERM GOAL #4    Title Increase knee left strength to a solid 4+/5 to provide good stability for accomplishment of functional activities.    Time 4    Period Weeks    Status On-going      PT LONG TERM GOAL #5   Title Perform a reciprocating stair gait with one railing with pain not > 2-3/10.    Time 4    Period Weeks    Status On-going                 Plan - 02/02/20 0901    Clinical Impression Statement Pt arrived today doing fairly well with mild 2/10 ache LT knee. Pt was able to perform all exs and progress with LAQ with 5# wt. His AROM was 5- 128 degrees today    Comorbidities Right knee surgery; Ulnar and Carpal tunnel release surgeries, h/o headaches and brain bleed.    Examination-Activity Limitations Locomotion Level;Other    Stability/Clinical Decision Making Stable/Uncomplicated    Rehab Potential Excellent    PT Duration 4 weeks    PT Treatment/Interventions ADLs/Self Care Home Management;Cryotherapy;Electrical Stimulation;Ultrasound;Iontophoresis 4mg /ml Dexamethasone;Gait training;Stair training;Functional mobility training;Therapeutic activities;Therapeutic exercise;Neuromuscular re-education;Manual techniques;Patient/family education;Passive range of motion;Vasopneumatic Device    PT Next Visit Plan PROM,  bike, pain-free left LE ther ex; Vaso           Patient will benefit from skilled therapeutic intervention in order to improve the following deficits and impairments:  Abnormal gait,Difficulty walking,Pain,Decreased activity tolerance,Decreased strength,Decreased mobility  Visit Diagnosis: Acute pain of left knee  Stiffness of left knee, not elsewhere classified     Problem List Patient Active Problem List   Diagnosis Date Noted  . Loss of weight 08/06/2015  . Rectal discharge 08/06/2015  . Abnormal CT scan, colon 08/06/2015  . SAH (subarachnoid hemorrhage) ([place]) 02/13/2014  . Annual physical exam 12/27/2012  . Hemorrhoids   . Gallbladder mass   .  Hyperlipidemia   . Pigmented nevus 03/30/2010    Jacob Braun,CHRIS, PTA 02/02/2020, 9:59 AM  [facility] [place]  [place], [place], [address] Phone: [phone]   Fax:  [phone]  Name: Jacob Braun MRN: [id number] Date of Birth: Sep 27, 1960

## 2020-02-07 ENCOUNTER — Ambulatory Visit: Payer: 59 | Admitting: Physical Therapy

## 2020-02-07 ENCOUNTER — Other Ambulatory Visit: Payer: Self-pay

## 2020-02-07 ENCOUNTER — Encounter: Payer: Self-pay | Admitting: Physical Therapy

## 2020-02-07 DIAGNOSIS — M25662 Stiffness of left knee, not elsewhere classified: Secondary | ICD-10-CM

## 2020-02-07 DIAGNOSIS — M25562 Pain in left knee: Secondary | ICD-10-CM | POA: Diagnosis not present

## 2020-02-07 NOTE — Therapy (Addendum)
[facility] [place], [place], [address] Phone: [phone]   Fax:  [phone]  Physical Therapy Treatment  Patient Details  Name: Jacob Braun MRN: [id number] Date of Birth: 04-May-1960 Referring Provider (PT): Victorino December MD   Encounter Date: 02/07/2020   PT End of Session - 02/07/20 0909    Visit Number 16    Number of Visits 18    Date for PT Re-Evaluation 02/13/20    Authorization Type FOTO.   10th visit FOTO 33 % limitation    PT Start Time 0900    PT Stop Time 0952    PT Time Calculation (min) 52 min    Activity Tolerance Patient tolerated treatment well    Behavior During Therapy WFL for tasks assessed/performed           Past Medical History:  Diagnosis Date  . Gallbladder mass   . Hemorrhoids   . Hyperlipidemia   . Medical history non-contributory   . Subarchnoid hemorrhage-brief coma     Past Surgical History:  Procedure Laterality Date  . CARPAL TUNNEL RELEASE  2008 & 2009  . CHEST SURGERY  1983   exploratory surgery   . COLONOSCOPY  10/30/2011   Procedure: COLONOSCOPY;  Surgeon: Rogene Houston, MD;  [facility];  Service: Endoscopy;  Laterality: N/A;  1030  . COLONOSCOPY N/A 08/17/2015   Procedure: COLONOSCOPY;  Surgeon: Rogene Houston, MD;  [facility];  Service: Endoscopy;  Laterality: N/A;  2:10  . KNEE CARTILAGE SURGERY  2006   Right  . RADIOLOGY WITH ANESTHESIA N/A 02/14/2014   Procedure: Aneurysm Coiling;  Surgeon: Consuella Lose, MD;  Location: [place];  Service: Radiology;  Laterality: N/A;  . ULNAR TUNNEL RELEASE  2009   Left  . VARICOCELECTOMY  1992  . VASECTOMY  1999    There were no vitals filed for this visit.   Subjective Assessment - 02/07/20 1005    Subjective COVID-19 screen performed prior to patient entering clinic. Reports ongoing left medial knee pain with some soreness after last session but stated he was doing a lot of physical activity  over the weekend.    Pertinent History Right knee surgery; Ulnar and Carpal tunnel release surgeries, h/o headaches and brain bleed.    Limitations Walking    How long can you stand comfortably? Varies.    How long can you walk comfortably? 2/10 of mile.    Patient Stated Goals Get out of pain and perform ADL's like before surgery.    Currently in Pain? Yes    Pain Score 1     Pain Location Knee    Pain Orientation Left;Medial    Pain Descriptors / Indicators Aching;Sore    Pain Type Surgical pain              [facility] PT Assessment - 02/07/20 0001      Assessment   Medical Diagnosis Left knee pain.    Referring Provider (PT) Victorino December MD    Onset Date/Surgical Date 11/20/19    Next MD Visit 01/2020      Precautions   Precaution Comments PAIN-FREE LEFT LE THER EX.                         [facility] Adult PT Treatment/Exercise - 02/07/20 0001      Exercises   Exercises Knee/Hip      Knee/Hip Exercises: Aerobic   Stationary Bike  Level 2 to 5 x 12 minutes.      Knee/Hip Exercises: Standing   Knee Flexion Strengthening;Left;3 sets;20 reps    Knee Flexion Limitations 3#    Terminal Knee Extension Strengthening;Left;3 sets;20 reps;Theraband    Theraband Level (Terminal Knee Extension) Level 4 (Blue)    Hip Abduction Stengthening;Left;3 sets;20 reps    Abduction Limitations 3#    Hip Extension Stengthening;Left;3 sets;20 reps;Knee bent    Extension Limitations 3#    Rocker Board 3 minutes    Other Standing Knee Exercises balance beam lateral walking intermittent UE support x2 minutes      Knee/Hip Exercises: Seated   Long Arc Quad --    Long Arc Quad [employer] --      Vasopneumatic   Number Minutes Vasopneumatic  10 minutes    Vasopnuematic Location  Knee    Vasopneumatic Pressure Low    Vasopneumatic Temperature  34 edema/ soreness      Manual Therapy   Manual Therapy Soft tissue mobilization    Soft tissue mobilization STW/M and IASTM to distal  hamstrings to improve extension                       PT Long Term Goals - 12/27/19 0815      PT LONG TERM GOAL #1   Title Independent with a HEP.    Time 4    Period Weeks    Status On-going      PT LONG TERM GOAL #2   Title Full active left knee extension in order to normalize gait.    Time 4    Period Weeks    Status On-going      PT LONG TERM GOAL #3   Title Active knee flexion to 120 degrees+ so the patient can perform functional tasks and do so with pain not > 2-3/10.    Time 4    Period Weeks    Status Achieved      PT LONG TERM GOAL #4   Title Increase knee left strength to a solid 4+/5 to provide good stability for accomplishment of functional activities.    Time 4    Period Weeks    Status On-going      PT LONG TERM GOAL #5   Title Perform a reciprocating stair gait with one railing with pain not > 2-3/10.    Time 4    Period Weeks    Status On-going                 Plan - 02/07/20 0956    Clinical Impression Statement Patient responded well to therapy session though with R hip soreness with standing TEs. Patient demonstrated excellent form and technique with all TEs. Patient with mild adhesions with STW/M and IASTM to distal hamstrings and quads. No adverse affects upon removal.    Personal Factors and Comorbidities Comorbidity 1;Comorbidity 2    Comorbidities Right knee surgery; Ulnar and Carpal tunnel release surgeries, h/o headaches and brain bleed.    Examination-Activity Limitations Locomotion Level;Other    Examination-Participation Restrictions Other    Stability/Clinical Decision Making Stable/Uncomplicated    Clinical Decision Making Low    Rehab Potential Excellent    PT Duration 4 weeks    PT Treatment/Interventions ADLs/Self Care Home Management;Cryotherapy;Electrical Stimulation;Ultrasound;Iontophoresis 4mg /ml Dexamethasone;Gait training;Stair training;Functional mobility training;Therapeutic activities;Therapeutic  exercise;Neuromuscular re-education;Manual techniques;Patient/family education;Passive range of motion;Vasopneumatic Device    PT Next Visit Plan PROM,  bike, pain-free left LE ther ex; Vaso  Consulted and Agree with Plan of Care Patient           Patient will benefit from skilled therapeutic intervention in order to improve the following deficits and impairments:  Abnormal gait,Difficulty walking,Pain,Decreased activity tolerance,Decreased strength,Decreased mobility  Visit Diagnosis: Acute pain of left knee  Stiffness of left knee, not elsewhere classified     Problem List Patient Active Problem List   Diagnosis Date Noted  . Loss of weight 08/06/2015  . Rectal discharge 08/06/2015  . Abnormal CT scan, colon 08/06/2015  . SAH (subarachnoid hemorrhage) ([place]) 02/13/2014  . Annual physical exam 12/27/2012  . Hemorrhoids   . Gallbladder mass   . Hyperlipidemia   . Pigmented nevus 03/30/2010    Gabriela Eves, PT, DPT 02/07/2020, 10:33 AM  [facility] [address] [place], [place], [address] Phone: [phone]   Fax:  [phone]  Name: Jacob Braun MRN: [id number] Date of Birth: June 22, 1960

## 2020-02-09 ENCOUNTER — Other Ambulatory Visit: Payer: Self-pay

## 2020-02-09 ENCOUNTER — Ambulatory Visit: Payer: 59 | Admitting: *Deleted

## 2020-02-09 DIAGNOSIS — M25562 Pain in left knee: Secondary | ICD-10-CM

## 2020-02-09 DIAGNOSIS — M25662 Stiffness of left knee, not elsewhere classified: Secondary | ICD-10-CM

## 2020-02-09 NOTE — Therapy (Signed)
[facility] [place], [place], [address] Phone: [phone]   Fax:  [phone]  Physical Therapy Treatment  Patient Details  Name: Jacob Braun MRN: [id number] Date of Birth: 1960/05/05 Referring Provider (PT): Victorino December MD   Encounter Date: 02/09/2020   PT End of Session - 02/09/20 0918    Visit Number 17    Number of Visits 18    Date for PT Re-Evaluation 02/13/20    Authorization Type FOTO.   10th visit FOTO 33 % limitation, FOTO DC 25%    PT Start Time 0900    PT Stop Time 0955    PT Time Calculation (min) 55 min           Past Medical History:  Diagnosis Date  . Gallbladder mass   . Hemorrhoids   . Hyperlipidemia   . Medical history non-contributory   . Subarchnoid hemorrhage-brief coma     Past Surgical History:  Procedure Laterality Date  . CARPAL TUNNEL RELEASE  2008 & 2009  . CHEST SURGERY  1983   exploratory surgery   . COLONOSCOPY  10/30/2011   Procedure: COLONOSCOPY;  Surgeon: Rogene Houston, MD;  [facility];  Service: Endoscopy;  Laterality: N/A;  1030  . COLONOSCOPY N/A 08/17/2015   Procedure: COLONOSCOPY;  Surgeon: Rogene Houston, MD;  [facility];  Service: Endoscopy;  Laterality: N/A;  2:10  . KNEE CARTILAGE SURGERY  2006   Right  . RADIOLOGY WITH ANESTHESIA N/A 02/14/2014   Procedure: Aneurysm Coiling;  Surgeon: Consuella Lose, MD;  Location: [place];  Service: Radiology;  Laterality: N/A;  . ULNAR TUNNEL RELEASE  2009   Left  . VARICOCELECTOMY  1992  . VASECTOMY  1999    There were no vitals filed for this visit.   Subjective Assessment - 02/09/20 0915    Subjective COVID-19 screen performed prior to patient entering clinic. Reports ongoing left medial knee pain with some soreness after last session but stated he is on his feet a lot    Pain Score 1     Pain Location Knee    Pain Orientation Left;Medial    Pain Descriptors / Indicators Aching;Sore     Pain Type Surgical pain                             [facility] Adult PT Treatment/Exercise - 02/09/20 0001      Exercises   Exercises Knee/Hip      Knee/Hip Exercises: Aerobic   Stationary Bike Level 2 to 5 x 12 minutes.      Knee/Hip Exercises: Standing   Knee Flexion Strengthening;Left;3 sets;20 reps    Knee Flexion Limitations 3#    Forward Lunges Left;2 sets;10 reps;5 reps   ATG split squat with UE assist pain free   Hip Abduction Stengthening;Left;3 sets;20 reps    Abduction Limitations 3#    Hip Extension Stengthening;Left;3 sets;20 reps;Knee bent    Extension Limitations 3#    Rocker Board 3 minutes      Knee/Hip Exercises: Seated   Long Arc Quad Strengthening;Left;20 reps;3 sets    Long Arc Quad Weight 5 lbs.      Vasopneumatic   Number Minutes Vasopneumatic  10 minutes    Vasopnuematic Location  Knee    Vasopneumatic Pressure Low    Vasopneumatic Temperature  34 edema/ soreness  PT Long Term Goals - 12/27/19 0815      PT LONG TERM GOAL #1   Title Independent with a HEP.    Time 4    Period Weeks    Status On-going      PT LONG TERM GOAL #2   Title Full active left knee extension in order to normalize gait.    Time 4    Period Weeks    Status On-going      PT LONG TERM GOAL #3   Title Active knee flexion to 120 degrees+ so the patient can perform functional tasks and do so with pain not > 2-3/10.    Time 4    Period Weeks    Status Achieved      PT LONG TERM GOAL #4   Title Increase knee left strength to a solid 4+/5 to provide good stability for accomplishment of functional activities.    Time 4    Period Weeks    Status On-going      PT LONG TERM GOAL #5   Title Perform a reciprocating stair gait with one railing with pain not > 2-3/10.    Time 4    Period Weeks    Status On-going                 Plan - 02/09/20 0944    Clinical Impression Statement Pt arrived today doing fairly  well, but with mild ache. He was able to perform all exs today with added ATG split squat on 14 in box pain free and did well.    Comorbidities Right knee surgery; Ulnar and Carpal tunnel release surgeries, h/o headaches and brain bleed.    Examination-Participation Restrictions Other    Rehab Potential Excellent    PT Duration 4 weeks    PT Treatment/Interventions ADLs/Self Care Home Management;Cryotherapy;Electrical Stimulation;Ultrasound;Iontophoresis 4mg /ml Dexamethasone;Gait training;Stair training;Functional mobility training;Therapeutic activities;Therapeutic exercise;Neuromuscular re-education;Manual techniques;Patient/family education;Passive range of motion;Vasopneumatic Device    PT Next Visit Plan PROM,  bike, pain-free left LE ther ex; Vaso           Patient will benefit from skilled therapeutic intervention in order to improve the following deficits and impairments:  Abnormal gait,Difficulty walking,Pain,Decreased activity tolerance,Decreased strength,Decreased mobility  Visit Diagnosis: Acute pain of left knee  Stiffness of left knee, not elsewhere classified     Problem List Patient Active Problem List   Diagnosis Date Noted  . Loss of weight 08/06/2015  . Rectal discharge 08/06/2015  . Abnormal CT scan, colon 08/06/2015  . SAH (subarachnoid hemorrhage) ([place]) 02/13/2014  . Annual physical exam 12/27/2012  . Hemorrhoids   . Gallbladder mass   . Hyperlipidemia   . Pigmented nevus 03/30/2010    Leenah Seidner,CHRIS, PTA 02/09/2020, 12:00 PM  [facility] [place], [place], [address] Phone: [phone]   Fax:  [phone]  Name: Jacob Braun MRN: [id number] Date of Birth: [birth year]

## 2020-02-14 ENCOUNTER — Other Ambulatory Visit: Payer: Self-pay

## 2020-02-14 ENCOUNTER — Ambulatory Visit: Payer: 59 | Attending: Orthopedic Surgery | Admitting: Physical Therapy

## 2020-02-14 ENCOUNTER — Encounter: Payer: Self-pay | Admitting: Physical Therapy

## 2020-02-14 DIAGNOSIS — M25662 Stiffness of left knee, not elsewhere classified: Secondary | ICD-10-CM | POA: Diagnosis present

## 2020-02-14 DIAGNOSIS — M25562 Pain in left knee: Secondary | ICD-10-CM | POA: Diagnosis present

## 2020-02-14 NOTE — Therapy (Signed)
[facility] [place], [place], [address] Phone: [phone]   Fax:  [phone]  Physical Therapy Treatment  Patient Details  Name: Jacob Braun MRN: [id number] Date of Birth: [birth year] Referring Provider (PT): Victorino December MD   Encounter Date: 02/14/2020   PT End of Session - 02/14/20 1030    Visit Number 18    Number of Visits 18    Date for PT Re-Evaluation 02/13/20    Authorization Type FOTO.   10th visit FOTO 33 % limitation, FOTO DC 25%    PT Start Time 1029    PT Stop Time 1110    PT Time Calculation (min) 41 min    Activity Tolerance Patient tolerated treatment well    Behavior During Therapy WFL for tasks assessed/performed           Past Medical History:  Diagnosis Date  . Gallbladder mass   . Hemorrhoids   . Hyperlipidemia   . Medical history non-contributory   . Subarchnoid hemorrhage-brief coma     Past Surgical History:  Procedure Laterality Date  . CARPAL TUNNEL RELEASE  2008 & 2009  . CHEST SURGERY  1983   exploratory surgery   . COLONOSCOPY  10/30/2011   Procedure: COLONOSCOPY;  Surgeon: Rogene Houston, MD;  [facility];  Service: Endoscopy;  Laterality: N/A;  1030  . COLONOSCOPY N/A 08/17/2015   Procedure: COLONOSCOPY;  Surgeon: Rogene Houston, MD;  [facility];  Service: Endoscopy;  Laterality: N/A;  2:10  . KNEE CARTILAGE SURGERY  2006   Right  . RADIOLOGY WITH ANESTHESIA N/A 02/14/2014   Procedure: Aneurysm Coiling;  Surgeon: Consuella Lose, MD;  Location: [place];  Service: Radiology;  Laterality: N/A;  . ULNAR TUNNEL RELEASE  2009   Left  . VARICOCELECTOMY  1992  . VASECTOMY  1999    There were no vitals filed for this visit.   Subjective Assessment - 02/14/20 1030    Subjective COVID-19 screen performed prior to patient entering clinic. Reports still having some discomfort in inferior L knee.    Pertinent History Right knee surgery; Ulnar and Carpal  tunnel release surgeries, h/o headaches and brain bleed.    Limitations Walking    How long can you stand comfortably? Varies.    How long can you walk comfortably? 2/10 of mile.    Patient Stated Goals Get out of pain and perform ADL's like before surgery.    Currently in Pain? No/denies              [facility] PT Assessment - 02/14/20 0001      Assessment   Medical Diagnosis Left knee pain.    Referring Provider (PT) Victorino December MD    Onset Date/Surgical Date 11/20/19    Next MD Visit 04/2020 PRN      Precautions   Precaution Comments PAIN-FREE LEFT LE THER EX.      Restrictions   Weight Bearing Restrictions No      ROM / Strength   AROM / PROM / Strength AROM;Strength      AROM   Overall AROM  Within functional limits for tasks performed    AROM Assessment Site Knee    Right/Left Knee Left    Left Knee Extension 0    Left Knee Flexion 127      Strength   Overall Strength Within functional limits for tasks performed    Strength Assessment Site Knee;Hip    Right/Left  Hip Left    Left Hip Flexion 4+/5    Left Hip ABduction 4+/5    Right/Left Knee Left    Left Knee Flexion 4+/5    Left Knee Extension 4+/5                         [facility] Adult PT Treatment/Exercise - 02/14/20 0001      Ambulation/Gait   Stairs Yes    Stairs Assistance 7: Independent    Stair Management Technique One rail Right;Alternating pattern;Forwards    Number of Stairs 4   x3 RT   Height of Stairs 6.5      Knee/Hip Exercises: Aerobic   Stationary Bike Level 2 to 5,  seat 6 x 12 minutes.      Knee/Hip Exercises: Standing   Knee Flexion Strengthening;Left;3 sets;20 reps    Knee Flexion Limitations 3#    Terminal Knee Extension Strengthening;Left;2 sets;10 reps;Limitations    Terminal Knee Extension Limitations Orange XTS    Hip Abduction Stengthening;Left;3 sets;20 reps    Abduction Limitations 3#    Hip Extension Stengthening;Left;3 sets;20 reps;Knee bent    Extension  Limitations 3#    Functional Squat 20 reps   LLE split squat   Walking with Sports Cord Backwards, sideways orange XTS x10 reps each      Knee/Hip Exercises: Seated   Long Arc Quad Strengthening;Left;20 reps;3 sets    Long Arc Quad Weight 5 lbs.                       PT Long Term Goals - 02/14/20 1050      PT LONG TERM GOAL #1   Title Independent with a HEP.    Time 4    Period Weeks    Status Achieved      PT LONG TERM GOAL #2   Title Full active left knee extension in order to normalize gait.    Time 4    Period Weeks    Status Achieved      PT LONG TERM GOAL #3   Title Active knee flexion to 120 degrees+ so the patient can perform functional tasks and do so with pain not > 2-3/10.    Time 4    Period Weeks    Status Achieved      PT LONG TERM GOAL #4   Title Increase knee left strength to a solid 4+/5 to provide good stability for accomplishment of functional activities.    Time 4    Period Weeks    Status Achieved      PT LONG TERM GOAL #5   Title Perform a reciprocating stair gait with one railing with pain not > 2-3/10.    Time 4    Period Weeks    Status Achieved                 Plan - 02/14/20 1122    Clinical Impression Statement Patient has been able to progress well following L arthroscopic surgery. Patient able to tolerate progression of quad control and strengthening without complaint of pain. Patient able to achieve all LTGs with ROM and strength. Stair ambulation goal met as well with "some" discomfort which patient feels is "normal." Patient denied any modalities upon end of treatment.    Personal Factors and Comorbidities Comorbidity 1;Comorbidity 2    Comorbidities Right knee surgery; Ulnar and Carpal tunnel release surgeries, h/o headaches and brain bleed.  Examination-Activity Limitations Locomotion Level;Other    Examination-Participation Restrictions Other    Stability/Clinical Decision Making Stable/Uncomplicated    Rehab  Potential Excellent    PT Duration 4 weeks    PT Treatment/Interventions ADLs/Self Care Home Management;Cryotherapy;Electrical Stimulation;Ultrasound;Iontophoresis 27m/ml Dexamethasone;Gait training;Stair training;Functional mobility training;Therapeutic activities;Therapeutic exercise;Neuromuscular re-education;Manual techniques;Patient/family education;Passive range of motion;Vasopneumatic Device    PT Next Visit Plan PROM,  bike, pain-free left LE ther ex; Vaso    Consulted and Agree with Plan of Care Patient           Patient will benefit from skilled therapeutic intervention in order to improve the following deficits and impairments:  Abnormal gait,Difficulty walking,Pain,Decreased activity tolerance,Decreased strength,Decreased mobility  Visit Diagnosis: Acute pain of left knee  Stiffness of left knee, not elsewhere classified     Problem List Patient Active Problem List   Diagnosis Date Noted  . Loss of weight 08/06/2015  . Rectal discharge 08/06/2015  . Abnormal CT scan, colon 08/06/2015  . SAH (subarachnoid hemorrhage) ([place] 02/13/2014  . Annual physical exam 12/27/2012  . Hemorrhoids   . Gallbladder mass   . Hyperlipidemia   . Pigmented nevus 03/30/2010    KStandley Brooking PTA 02/14/20 11:30 AM   [facility] [address] [address]Phone: [phone]  Fax:  [phone] Name: TGUNTER CONDEMRN: [id number] of Birth: [birth year] PHYSICAL THERAPY DISCHARGE SUMMARY  Visits from Start of Care: 18.  Current functional level related to goals / functional outcomes: See above.   Remaining deficits: All goals met.   Education / Equipment: HEP. Plan: Patient agrees to discharge.  Patient goals were met. Patient is being discharged due to meeting the stated rehab goals.  ?????          CMaliApplegate MPT

## 2022-08-08 ENCOUNTER — Encounter (INDEPENDENT_AMBULATORY_CARE_PROVIDER_SITE_OTHER): Payer: Self-pay | Admitting: *Deleted

## 2022-09-02 ENCOUNTER — Encounter (INDEPENDENT_AMBULATORY_CARE_PROVIDER_SITE_OTHER): Payer: Self-pay | Admitting: *Deleted

## 2022-09-02 ENCOUNTER — Telehealth (INDEPENDENT_AMBULATORY_CARE_PROVIDER_SITE_OTHER): Payer: Self-pay | Admitting: *Deleted

## 2022-09-02 NOTE — Telephone Encounter (Addendum)
Who is your primary care physician: dickey  Reasons for the colonoscopy: hx polyps - 7 yr TCS  Have you had a colonoscopy before?  Yes, 2017  Do you have family history of colon cancer? no  Previous colonoscopy with polyps removed? no  Do you have a history colorectal cancer?   no  Are you diabetic? If yes, Type 1 or Type 2?    no  Do you have a prosthetic or mechanical heart valve? no  Do you have a pacemaker/defibrillator?   no  Have you had endocarditis/atrial fibrillation? no  Have you had joint replacement within the last 12 months?  no  Do you tend to be constipated or have to use laxatives? no  Do you have any history of drugs or alchohol?  no  Do you use supplemental oxygen?  no  Have you had a stroke or heart attack within the last 6 months? no  Do you take weight loss medication?  no  For male patients: have you had a hysterectomy?                                       are you post menopausal?                                                   do you still have your menstrual cycle?       Do you take any blood-thinning medications such as: (aspirin, warfarin, Plavix, Aggrenox)  no  If yes we need the name, milligram, dosage and who is prescribing doctor   Medications: vortex 2 x inhaler (long covid), naproxen 500 mg 1 tab twice daily, famotidine 20 mg as needed, hydroxyzine 25 mg 1 tab 2X as needed    Allergies  Allergen Reactions   Oxycodone Rash     Pharmacy: [facility]  Primary Insurance Name: [facility] [id number]  Best number where you can be reached: [phone]

## 2022-09-02 NOTE — Telephone Encounter (Signed)
This encounter was created in error - please disregard.

## 2022-09-03 NOTE — Telephone Encounter (Signed)
Ok to schedule.  Room 2  Thanks,  Vista Lawman, MD Gastroenterology and Hepatology [facility] Gastroenterology

## 2022-10-09 NOTE — Telephone Encounter (Signed)
Called pt to schedule. [place] for the month of OCT and request to call once we get Nov schedule.

## 2022-11-24 NOTE — Telephone Encounter (Signed)
LMOVM to call back to schedule 

## 2022-12-04 NOTE — Telephone Encounter (Signed)
Left message to return call 

## 2022-12-25 NOTE — Telephone Encounter (Signed)
Pt contacted. Pt states he is getting ready to have knee replacement. Pt would like to be called when we have Feb. Schedule.

## 2023-01-05 ENCOUNTER — Other Ambulatory Visit: Payer: Self-pay

## 2023-01-05 ENCOUNTER — Ambulatory Visit: Payer: 59 | Attending: Orthopedic Surgery | Admitting: Physical Therapy

## 2023-01-05 ENCOUNTER — Encounter: Payer: Self-pay | Admitting: Physical Therapy

## 2023-01-05 DIAGNOSIS — M6281 Muscle weakness (generalized): Secondary | ICD-10-CM | POA: Diagnosis present

## 2023-01-05 DIAGNOSIS — M25662 Stiffness of left knee, not elsewhere classified: Secondary | ICD-10-CM | POA: Insufficient documentation

## 2023-01-05 DIAGNOSIS — R600 Localized edema: Secondary | ICD-10-CM | POA: Insufficient documentation

## 2023-01-05 DIAGNOSIS — M25562 Pain in left knee: Secondary | ICD-10-CM | POA: Diagnosis present

## 2023-01-05 DIAGNOSIS — G8929 Other chronic pain: Secondary | ICD-10-CM | POA: Diagnosis present

## 2023-01-05 NOTE — Therapy (Signed)
OUTPATIENT PHYSICAL THERAPY LOWER EXTREMITY EVALUATION   Patient Name: Jacob Braun MRN: [id number] DOB:03/16/1960, 62 y.o., male Today's Date: 01/05/2023  END OF SESSION:  PT End of Session - 01/05/23 1139     Visit Number 1    Number of Visits 12    Date for PT Re-Evaluation 02/16/23    PT Start Time 1016    PT Stop Time 1059    PT Time Calculation (min) 43 min    Activity Tolerance Patient tolerated treatment well    Behavior During Therapy [facility] for tasks assessed/performed             Past Medical History:  Diagnosis Date   Gallbladder mass    Hemorrhoids    Hyperlipidemia    Medical history non-contributory    Subarchnoid hemorrhage-brief coma    Past Surgical History:  Procedure Laterality Date   CARPAL TUNNEL RELEASE  2008 & 2009   CHEST SURGERY  1983   exploratory surgery    COLONOSCOPY  10/30/2011   Procedure: COLONOSCOPY;  Surgeon: Malissa Hippo, MD;  [facility];  Service: Endoscopy;  Laterality: N/A;  1030   COLONOSCOPY N/A 08/17/2015   Procedure: COLONOSCOPY;  Surgeon: Malissa Hippo, MD;  [facility];  Service: Endoscopy;  Laterality: N/A;  2:10   KNEE CARTILAGE SURGERY  2006   Right   RADIOLOGY WITH ANESTHESIA N/A 02/14/2014   Procedure: Aneurysm Coiling;  Surgeon: Lisbeth Renshaw, MD;  Location: [facility];  Service: Radiology;  Laterality: N/A;   ULNAR TUNNEL RELEASE  2009   Left   VARICOCELECTOMY  1992   VASECTOMY  1999   Patient Active Problem List   Diagnosis Date Noted   Loss of weight 08/06/2015   Rectal discharge 08/06/2015   Abnormal CT scan, colon 08/06/2015   SAH (subarachnoid hemorrhage) ([facility]) 02/13/2014   Annual physical exam 12/27/2012   Hemorrhoids    Gallbladder mass    Hyperlipidemia    Pigmented nevus 03/30/2010    REFERRING PROVIDER: Georgina Pillion MD  REFERRING DIAG: S/p left partial knee replacement  THERAPY DIAG:  Chronic pain of left knee  Stiffness of left knee, not elsewhere  classified  Localized edema  Muscle weakness (generalized)  Rationale for Evaluation and Treatment: Rehabilitation  ONSET DATE: 01/02/23 (surgery date).    SUBJECTIVE:   SUBJECTIVE STATEMENT: The patient presents to the clinic s/p left partial total knee replacement performed on 01/02/23.  He is walking safely with a FWW and has TED hose donned and Aquacel intact.  His pain at rest is low this morning.  He is compliant to his HEP established in the hospital.    PERTINENT HISTORY: Previous left knee scope. PAIN:  Are you having pain? Yes: NPRS scale: 2/10 Pain location: Left knee. Pain description: Ache. Aggravating factors: Movement. Relieving factors: Rest.  PRECAUTIONS: Other: No ultrasound. WEIGHT BEARING RESTRICTIONS: No  FALLS:  Has patient fallen in last 6 months? No  LIVING ENVIRONMENT: Lives with: lives with their spouse Lives in: House/apartment Has following equipment at home: Dan Humphreys - 2 wheeled  OCCUPATION: Retired.  PLOF: Independent  PATIENT GOALS: Perform ADL's without left knee pain.   OBJECTIVE:  Note: Objective measures were completed at Evaluation unless otherwise noted.  EDEMA:  Circumferential: 7 cms greater on left than right.   PALPATION: Diffuse left knee pain currently.  LOWER EXTREMITY ROM:  In supine:  Left knee extension to -10 degrees with active flexion to 70 degrees and gentle passive  to 80 degrees.    LOWER EXTREMITY MMT:  Patient currently unable to perform an antigravity left SLR.  Left knee extension graded grossly at 3-/5.  GAIT: The patient  walking safely with a FWW with a decrease in step and stride length.    PATIENT EDUCATION:  Education details: Patient performing HEP as established in hospital. Person educated: Patient Education method: Explanation Education comprehension: verbalized understanding  HOME EXERCISE PROGRAM:  As above.  TODAY's TX:  LE elevation and vasopneumatic on low to patient's  left knee x 17 minutes.     ASSESSMENT:  CLINICAL IMPRESSION: The patient presents to [facility] s/p left partial knee replacement.  He presented to the clinic with a FWW, walking safely with a decrease in step and stride length.  His Aquacel is intact and he has TED hose donned.  He is compliant to his HEP. He has moderate edema currently.  He is lacking range of motion into flexion and extension currently.  He is currently unable to perform an antigravity left SLR.  His FOTO score is 31.65.  Patient will benefit from skilled physical therapy intervention to address pain and deficits.  OBJECTIVE IMPAIRMENTS: Abnormal gait, decreased activity tolerance, decreased ROM, decreased strength, increased edema, and pain.   ACTIVITY LIMITATIONS: carrying, lifting, bending, stairs, transfers, bed mobility, and locomotion level  PARTICIPATION LIMITATIONS: meal prep, cleaning, laundry, driving, and yard work  PERSONAL FACTORS: Time since onset of injury/illness/exacerbation and 1 comorbidity: prior knee surgery  are also affecting patient's functional outcome.   REHAB POTENTIAL: Excellent  CLINICAL DECISION MAKING: Stable/uncomplicated  EVALUATION COMPLEXITY: Low   GOALS:  SHORT TERM GOALS: Target date: 01/19/23  Ind with an initial HEP. Goal status: INITIAL  2.  Full active left knee extension.  Goal status: INITIAL  3.  Active left knee flexion to 100 degrees.  Goal status: INITIAL   LONG TERM GOALS: Target date: 02/16/23.  Ind with an advanced HEP.  Goal status: INITIAL  2.  Active left knee flexion to 115 degrees+ so the patient can perform functional tasks and do so with pain not > 2-3/10.  Goal status: INITIAL  3.  Increase left hip and knee strength to a solid 4+/5 to provide good stability for accomplishment of functional activities.  Goal status: INITIAL  4.  Perform a reciprocating stair gait with one railing with pain not > 2-3/10.  Goal status: INITIAL  5.  Perform ADL's  with left knee pain not > 2-3/10.  Goal status: INITIAL    PLAN:  PT FREQUENCY: 2x/week  PT DURATION: 6 weeks  PLANNED INTERVENTIONS: 97110-Therapeutic exercises, 97530- Therapeutic activity, [id number]- Neuromuscular re-education, 97535- Self Care, 81191- Manual therapy, 97014- Electrical stimulation (unattended), 97016- Vasopneumatic device, Patient/Family education, Stair training, Cryotherapy, and Moist heat  PLAN FOR NEXT SESSION: Nustep with progress to recumbent bike.  Progress into TKA protocol.  LE elevation and vasopneumatic.   Dustie Brittle, [place], PT 01/05/2023, 11:52 AM

## 2023-01-08 ENCOUNTER — Ambulatory Visit: Payer: 59

## 2023-01-08 DIAGNOSIS — M6281 Muscle weakness (generalized): Secondary | ICD-10-CM

## 2023-01-08 DIAGNOSIS — R600 Localized edema: Secondary | ICD-10-CM

## 2023-01-08 DIAGNOSIS — M25562 Pain in left knee: Secondary | ICD-10-CM | POA: Diagnosis not present

## 2023-01-08 DIAGNOSIS — M25662 Stiffness of left knee, not elsewhere classified: Secondary | ICD-10-CM

## 2023-01-08 DIAGNOSIS — G8929 Other chronic pain: Secondary | ICD-10-CM

## 2023-01-08 NOTE — Therapy (Signed)
OUTPATIENT PHYSICAL THERAPY LOWER EXTREMITY TREATMENT   Patient Name: Jacob Braun MRN: [id number] DOB:[birth year], 62 y.o., male Today's Date: 01/08/2023  END OF SESSION:  PT End of Session - 01/08/23 1604     Visit Number 2    Number of Visits 12    Date for PT Re-Evaluation 02/16/23    PT Start Time 1600    PT Stop Time 1653    PT Time Calculation (min) 53 min    Activity Tolerance Patient tolerated treatment well    Behavior During Therapy [facility] for tasks assessed/performed              Past Medical History:  Diagnosis Date   Gallbladder mass    Hemorrhoids    Hyperlipidemia    Medical history non-contributory    Subarchnoid hemorrhage-brief coma    Past Surgical History:  Procedure Laterality Date   CARPAL TUNNEL RELEASE  2008 & 2009   CHEST SURGERY  1983   exploratory surgery    COLONOSCOPY  10/30/2011   Procedure: COLONOSCOPY;  Surgeon: Malissa Hippo, MD;  [facility];  Service: Endoscopy;  Laterality: N/A;  1030   COLONOSCOPY N/A 08/17/2015   Procedure: COLONOSCOPY;  Surgeon: Malissa Hippo, MD;  [facility];  Service: Endoscopy;  Laterality: N/A;  2:10   KNEE CARTILAGE SURGERY  2006   Right   RADIOLOGY WITH ANESTHESIA N/A 02/14/2014   Procedure: Aneurysm Coiling;  Surgeon: Lisbeth Renshaw, MD;  Location: [facility] OR;  Service: Radiology;  Laterality: N/A;   ULNAR TUNNEL RELEASE  2009   Left   VARICOCELECTOMY  1992   VASECTOMY  1999   Patient Active Problem List   Diagnosis Date Noted   Loss of weight 08/06/2015   Rectal discharge 08/06/2015   Abnormal CT scan, colon 08/06/2015   SAH (subarachnoid hemorrhage) ([facility]) 02/13/2014   Annual physical exam 12/27/2012   Hemorrhoids    Gallbladder mass    Hyperlipidemia    Pigmented nevus 03/30/2010    REFERRING PROVIDER: Georgina Pillion MD  REFERRING DIAG: S/p left partial knee replacement  THERAPY DIAG:  Chronic pain of left knee  Stiffness of left knee, not elsewhere  classified  Localized edema  Muscle weakness (generalized)  Rationale for Evaluation and Treatment: Rehabilitation  ONSET DATE: 01/02/23 (surgery date).    SUBJECTIVE:   SUBJECTIVE STATEMENT: Patient reports that his knee is hurting a little today. However, it is getting better.   PERTINENT HISTORY: Previous left knee scope. PAIN:  Are you having pain? Yes: NPRS scale: 4/10 Pain location: Left knee. Pain description: Ache. Aggravating factors: Movement. Relieving factors: Rest.  PRECAUTIONS: Other: No ultrasound.  WEIGHT BEARING RESTRICTIONS: No  FALLS:  Has patient fallen in last 6 months? No  LIVING ENVIRONMENT: Lives with: lives with their spouse Lives in: House/apartment Has following equipment at home: Dan Humphreys - 2 wheeled  OCCUPATION: Retired.  PLOF: Independent  PATIENT GOALS: Perform ADL's without left knee pain.   OBJECTIVE:  Note: Objective measures were completed at Evaluation unless otherwise noted.  EDEMA:  Circumferential: 7 cms greater on left than right.   PALPATION: Diffuse left knee pain currently.  LOWER EXTREMITY ROM:  In supine:  Left knee extension to -10 degrees with active flexion to 70 degrees and gentle passive to 80 degrees.    LOWER EXTREMITY MMT:  Patient currently unable to perform an antigravity left SLR.  Left knee extension graded grossly at 3-/5.  GAIT: The patient  walking safely with  a FWW with a decrease in step and stride length.    PATIENT EDUCATION:  Education details: Patient performing HEP as established in hospital. Person educated: Patient Education method: Explanation Education comprehension: verbalized understanding  HOME EXERCISE PROGRAM: As above.  TODAY's TX:                                   01/08/23 EXERCISE LOG  Exercise Repetitions and Resistance Comments  Nustep  L1 x 15 minutes @ seat 10   Standing gastroc stretch  2 minutes   Lunges onto step  6" step x 2.5 minutes  LLE leading   Supine quad set  2 minutes w/ 5 second hold LLE only   AA SLR  15 reps    Supine heel slide 3 minutes     Blank cell = exercise not performed today  Modalities: no redness or adverse reaction to today's modalities  Date:  Vaso: Knee, 34 degrees; low pressure, 15 mins, Pain and Edema  ASSESSMENT:  CLINICAL IMPRESSION: Patient was introduced to multiple new interventions for improved knee mobility and quadriceps engagement. He required minimal cueing with active assisted straight leg raises to facilitate quadriceps engagement. He reported no significant increase in pain or discomfort with any of today's interventions. He noted that his knee felt "a little sore" upon the conclusion of treatment. He continues to require skilled physical therapy to address his remaining impairments to return to his prior level of function.   OBJECTIVE IMPAIRMENTS: Abnormal gait, decreased activity tolerance, decreased ROM, decreased strength, increased edema, and pain.   ACTIVITY LIMITATIONS: carrying, lifting, bending, stairs, transfers, bed mobility, and locomotion level  PARTICIPATION LIMITATIONS: meal prep, cleaning, laundry, driving, and yard work  PERSONAL FACTORS: Time since onset of injury/illness/exacerbation and 1 comorbidity: prior knee surgery  are also affecting patient's functional outcome.   REHAB POTENTIAL: Excellent  CLINICAL DECISION MAKING: Stable/uncomplicated  EVALUATION COMPLEXITY: Low   GOALS:  SHORT TERM GOALS: Target date: 01/19/23  Ind with an initial HEP. Goal status: INITIAL  2.  Full active left knee extension.  Goal status: INITIAL  3.  Active left knee flexion to 100 degrees.  Goal status: INITIAL   LONG TERM GOALS: Target date: 02/16/23.  Ind with an advanced HEP.  Goal status: INITIAL  2.  Active left knee flexion to 115 degrees+ so the patient can perform functional tasks and do so with pain not > 2-3/10.  Goal status: INITIAL  3.  Increase left hip and  knee strength to a solid 4+/5 to provide good stability for accomplishment of functional activities.  Goal status: INITIAL  4.  Perform a reciprocating stair gait with one railing with pain not > 2-3/10.  Goal status: INITIAL  5.  Perform ADL's with left knee pain not > 2-3/10.  Goal status: INITIAL    PLAN:  PT FREQUENCY: 2x/week  PT DURATION: 6 weeks  PLANNED INTERVENTIONS: 97110-Therapeutic exercises, 97530- Therapeutic activity, [id number]- Neuromuscular re-education, 97535- Self Care, 42595- Manual therapy, 97014- Electrical stimulation (unattended), 97016- Vasopneumatic device, Patient/Family education, Stair training, Cryotherapy, and Moist heat  PLAN FOR NEXT SESSION: Nustep with progress to recumbent bike.  Progress into TKA protocol.  LE elevation and vasopneumatic.   Granville Lewis, PT 01/08/2023, 5:01 PM

## 2023-01-12 ENCOUNTER — Ambulatory Visit: Payer: 59 | Admitting: *Deleted

## 2023-01-12 DIAGNOSIS — M25662 Stiffness of left knee, not elsewhere classified: Secondary | ICD-10-CM

## 2023-01-12 DIAGNOSIS — M25562 Pain in left knee: Secondary | ICD-10-CM | POA: Diagnosis not present

## 2023-01-12 DIAGNOSIS — R600 Localized edema: Secondary | ICD-10-CM

## 2023-01-12 DIAGNOSIS — G8929 Other chronic pain: Secondary | ICD-10-CM

## 2023-01-12 DIAGNOSIS — M6281 Muscle weakness (generalized): Secondary | ICD-10-CM

## 2023-01-12 NOTE — Therapy (Signed)
OUTPATIENT PHYSICAL THERAPY LOWER EXTREMITY TREATMENT   Patient Name: Jacob Braun MRN: [id number] DOB:[birth year], 62 y.o., male Today's Date: 01/12/2023  END OF SESSION:  PT End of Session - 01/12/23 1019     Visit Number 3    Number of Visits 12    Date for PT Re-Evaluation 02/16/23    PT Start Time 1015    PT Stop Time 1124    PT Time Calculation (min) 69 min              Past Medical History:  Diagnosis Date   Gallbladder mass    Hemorrhoids    Hyperlipidemia    Medical history non-contributory    Subarchnoid hemorrhage-brief coma    Past Surgical History:  Procedure Laterality Date   CARPAL TUNNEL RELEASE  2008 & 2009   CHEST SURGERY  1983   exploratory surgery    COLONOSCOPY  10/30/2011   Procedure: COLONOSCOPY;  Surgeon: Malissa Hippo, MD;  Location: [facility];  Service: Endoscopy;  Laterality: N/A;  1030   COLONOSCOPY N/A 08/17/2015   Procedure: COLONOSCOPY;  Surgeon: Malissa Hippo, MD;  Location: [facility];  Service: Endoscopy;  Laterality: N/A;  2:10   KNEE CARTILAGE SURGERY  2006   Right   RADIOLOGY WITH ANESTHESIA N/A 02/14/2014   Procedure: Aneurysm Coiling;  Surgeon: Lisbeth Renshaw, MD;  Location: [facility];  Service: Radiology;  Laterality: N/A;   ULNAR TUNNEL RELEASE  2009   Left   VARICOCELECTOMY  1992   VASECTOMY  1999   Patient Active Problem List   Diagnosis Date Noted   Loss of weight 08/06/2015   Rectal discharge 08/06/2015   Abnormal CT scan, colon 08/06/2015   SAH (subarachnoid hemorrhage) (HCC) 02/13/2014   Annual physical exam 12/27/2012   Hemorrhoids    Gallbladder mass    Hyperlipidemia    Pigmented nevus 03/30/2010    REFERRING PROVIDER: Georgina Pillion MD  REFERRING DIAG: S/p left partial knee replacement  THERAPY DIAG:  Chronic pain of left knee  Stiffness of left knee, not elsewhere classified  Localized edema  Muscle weakness (generalized)  Rationale for Evaluation and Treatment:  Rehabilitation  ONSET DATE: 01/02/23 (surgery date).    SUBJECTIVE:   SUBJECTIVE STATEMENT: Patient reports that his LT knee is sore 2-3/10 today.  Rt knee hurts  PERTINENT HISTORY: Previous left knee scope. PAIN:  Are you having pain? Yes: NPRS scale: 4/10 Pain location: Left knee. Pain description: Ache. Aggravating factors: Movement. Relieving factors: Rest.  PRECAUTIONS: Other: No ultrasound.  WEIGHT BEARING RESTRICTIONS: No  FALLS:  Has patient fallen in last 6 months? No  LIVING ENVIRONMENT: Lives with: lives with their spouse Lives in: House/apartment Has following equipment at home: Dan Humphreys - 2 wheeled  OCCUPATION: Retired.  PLOF: Independent  PATIENT GOALS: Perform ADL's without left knee pain.   OBJECTIVE:  Note: Objective measures were completed at Evaluation unless otherwise noted.  EDEMA:  Circumferential: 7 cms greater on left than right.   PALPATION: Diffuse left knee pain currently.  LOWER EXTREMITY ROM:  In supine:  Left knee extension to -10 degrees with active flexion to 70 degrees and gentle passive to 80 degrees.    LOWER EXTREMITY MMT:  Patient currently unable to perform an antigravity left SLR.  Left knee extension graded grossly at 3-/5.  GAIT: The patient  walking safely with a FWW with a decrease in step and stride length.    PATIENT EDUCATION:  Education details: Patient performing HEP  as established in hospital. Person educated: Patient Education method: Explanation Education comprehension: verbalized understanding  HOME EXERCISE PROGRAM: As above.            Today's Date                        01/12/23        EXERCISE LOG     LT   Exercise Repetitions and Resistance Comments  Nustep  L1 x 20  minutes @ seat 10   Rocker board  5  minutes     PF/DF and calf stretching   4in step up  2x10 LT LE   UE assist  Lunges onto step   LLE leading  Supine quad set  X 10 LLE only   SAQ 3x 10   LAQs X 10 with end-range  assist   AA SLR  10  reps    Supine heel slide 3  x 10 with assistance for flexion progression    Blank cell = exercise not performed today  Modalities: no redness or adverse reaction to today's modalities Manual PROM for extension and flexion Date:  Vaso: Knee, 34 degrees; low pressure, 15 mins, Pain and Edema  ASSESSMENT:  CLINICAL IMPRESSION: Patient  arrived today doing fairly well with LT knee and was able to continue with Therex for ROM progression flexion and extension as well as quad activation OKC and CKC exs. He had minimal c/o pain except for flexion stretch end range pain. Vaso end of session for edema control. F/U with MD on Thursday.     OBJECTIVE IMPAIRMENTS: Abnormal gait, decreased activity tolerance, decreased ROM, decreased strength, increased edema, and pain.   ACTIVITY LIMITATIONS: carrying, lifting, bending, stairs, transfers, bed mobility, and locomotion level  PARTICIPATION LIMITATIONS: meal prep, cleaning, laundry, driving, and yard work  PERSONAL FACTORS: Time since onset of injury/illness/exacerbation and 1 comorbidity: prior knee surgery  are also affecting patient's functional outcome.   REHAB POTENTIAL: Excellent  CLINICAL DECISION MAKING: Stable/uncomplicated  EVALUATION COMPLEXITY: Low   GOALS:  SHORT TERM GOALS: Target date: 01/19/23  Ind with an initial HEP. Goal status: INITIAL  2.  Full active left knee extension.  Goal status: INITIAL  3.  Active left knee flexion to 100 degrees.  Goal status: INITIAL   LONG TERM GOALS: Target date: 02/16/23.  Ind with an advanced HEP.  Goal status: INITIAL  2.  Active left knee flexion to 115 degrees+ so the patient can perform functional tasks and do so with pain not > 2-3/10.  Goal status: INITIAL  3.  Increase left hip and knee strength to a solid 4+/5 to provide good stability for accomplishment of functional activities.  Goal status: INITIAL  4.  Perform a reciprocating stair gait with  one railing with pain not > 2-3/10.  Goal status: INITIAL  5.  Perform ADL's with left knee pain not > 2-3/10.  Goal status: INITIAL    PLAN:  PT FREQUENCY: 2x/week  PT DURATION: 6 weeks  PLANNED INTERVENTIONS: 97110-Therapeutic exercises, 97530- Therapeutic activity, [id number]- Neuromuscular re-education, 97535- Self Care, [id number]- Manual therapy, 97014- Electrical stimulation (unattended), 97016- Vasopneumatic device, Patient/Family education, Stair training, Cryotherapy, and Moist heat  PLAN FOR NEXT SESSION: Nustep with progress to recumbent bike.  Progress into TKA protocol.  LE elevation and vasopneumatic.   Rebie Peale,CHRIS, PTA 01/12/2023, 11:27 AM

## 2023-01-15 ENCOUNTER — Ambulatory Visit: Payer: 59 | Attending: Orthopedic Surgery | Admitting: *Deleted

## 2023-01-15 ENCOUNTER — Encounter: Payer: Self-pay | Admitting: *Deleted

## 2023-01-15 DIAGNOSIS — G8929 Other chronic pain: Secondary | ICD-10-CM

## 2023-01-15 DIAGNOSIS — M6281 Muscle weakness (generalized): Secondary | ICD-10-CM | POA: Diagnosis present

## 2023-01-15 DIAGNOSIS — R600 Localized edema: Secondary | ICD-10-CM

## 2023-01-15 DIAGNOSIS — M25662 Stiffness of left knee, not elsewhere classified: Secondary | ICD-10-CM | POA: Diagnosis present

## 2023-01-15 DIAGNOSIS — M25562 Pain in left knee: Secondary | ICD-10-CM | POA: Insufficient documentation

## 2023-01-15 NOTE — Therapy (Addendum)
 OUTPATIENT PHYSICAL THERAPY LOWER EXTREMITY TREATMENT   Patient Name: Jacob Braun MRN: [id number] DOB:12-11-1960, 63 y.o., male Today's Date: 01/15/2023  END OF SESSION:  PT End of Session - 01/15/23 1056     Visit Number 4    Number of Visits 12    Date for PT Re-Evaluation 02/16/23    PT Start Time 1015    PT Stop Time 1115    PT Time Calculation (min) 60 min              Past Medical History:  Diagnosis Date   Gallbladder mass    Hemorrhoids    Hyperlipidemia    Medical history non-contributory    Subarchnoid hemorrhage-brief coma    Past Surgical History:  Procedure Laterality Date   CARPAL TUNNEL RELEASE  2008 & 2009   CHEST SURGERY  1983   exploratory surgery    COLONOSCOPY  10/30/2011   Procedure: COLONOSCOPY;  Surgeon: Claudis RAYMOND Rivet, MD;  Location: [facility];  Service: Endoscopy;  Laterality: N/A;  1030   COLONOSCOPY N/A 08/17/2015   Procedure: COLONOSCOPY;  Surgeon: Claudis RAYMOND Rivet, MD;  Location: [facility];  Service: Endoscopy;  Laterality: N/A;  2:10   KNEE CARTILAGE SURGERY  2006   Right   RADIOLOGY WITH ANESTHESIA N/A 02/14/2014   Procedure: Aneurysm Coiling;  Surgeon: Gerldine Maizes, MD;  Location: [facility] OR;  Service: Radiology;  Laterality: N/A;   ULNAR TUNNEL RELEASE  2009   Left   VARICOCELECTOMY  1992   VASECTOMY  1999   Patient Active Problem List   Diagnosis Date Noted   Loss of weight 08/06/2015   Rectal discharge 08/06/2015   Abnormal CT scan, colon 08/06/2015   SAH (subarachnoid hemorrhage) (HCC) 02/13/2014   Annual physical exam 12/27/2012   Hemorrhoids    Gallbladder mass    Hyperlipidemia    Pigmented nevus 03/30/2010    REFERRING PROVIDER: Toribio Nearing MD  REFERRING DIAG: S/p left partial knee replacement  THERAPY DIAG:  Chronic pain of left knee  Stiffness of left knee, not elsewhere classified  Localized edema  Muscle weakness (generalized)  Rationale for Evaluation and Treatment:  Rehabilitation  ONSET DATE: 01/02/23 (surgery date).    SUBJECTIVE:   SUBJECTIVE STATEMENT: Patient reports that his LT knee is sore 2-4/10 today.  Rt knee hurts. To MD today  PERTINENT HISTORY: Previous left knee scope. PAIN:  Are you having pain? Yes: NPRS scale: 4/10 Pain location: Left knee. Pain description: Ache. Aggravating factors: Movement. Relieving factors: Rest.  PRECAUTIONS: Other: No ultrasound.  WEIGHT BEARING RESTRICTIONS: No  FALLS:  Has patient fallen in last 6 months? No  LIVING ENVIRONMENT: Lives with: lives with their spouse Lives in: House/apartment Has following equipment at home: Vannie - 2 wheeled  OCCUPATION: Retired.  PLOF: Independent  PATIENT GOALS: Perform ADL's without left knee pain.   OBJECTIVE:  Note: Objective measures were completed at Evaluation unless otherwise noted.  EDEMA:  Circumferential: 7 cms greater on left than right.   PALPATION: Diffuse left knee pain currently.  LOWER EXTREMITY ROM:  In supine:  Left knee extension to -10 degrees with active flexion to 70 degrees and gentle passive to 80 degrees.    LOWER EXTREMITY MMT:  Patient currently unable to perform an antigravity left SLR.  Left knee extension graded grossly at 3-/5.  GAIT: The patient  walking safely with a FWW with a decrease in step and stride length.    PATIENT EDUCATION:  Education details:  Patient performing HEP as established in hospital. Person educated: Patient Education method: Explanation Education comprehension: verbalized understanding  HOME EXERCISE PROGRAM: As above.            Today's Date                        01-15-23        EXERCISE LOG     LT Knee  Exercise Repetitions and Resistance Comments  Nustep  L2 x 20  minutes @ seat 10-8 Flexion progression  Rocker board     Heel ps and toe ups x20   4in step up     UE assist  Lunges onto step   LLE leading  Supine quad set  X 10 hold 5 secs LLE only   SAQ 3x 10   with  2# wt   LAQs    AA SLR  10  reps    Supine heel slide 3  x 10 with assistance for flexion progression     100 degrees today   Blank cell = exercise not performed today   Gait with SPC x 150 feet in clinic   Modalities: no redness or adverse reaction to today's modalities  Manual PROM for extension and flexion   with Pt supine Date:  Vaso: Knee, 34 degrees; low pressure, 15 mins, Pain and Edema  ASSESSMENT:  CLINICAL IMPRESSION: Patient arrived today doing fairly well with LT knee  and reports to MD today for removal of bandage. He was able to continue with Therex for ROM progression flexion and extension as well as quad activation. Still with extensor lag with SLR. He did well with gait using SPC.  He had minimal c/o pain except for flexion stretch end range pain  0-110 degrees. Vaso end of session for edema control. F/U with MD on today.     OBJECTIVE IMPAIRMENTS: Abnormal gait, decreased activity tolerance, decreased ROM, decreased strength, increased edema, and pain.   ACTIVITY LIMITATIONS: carrying, lifting, bending, stairs, transfers, bed mobility, and locomotion level  PARTICIPATION LIMITATIONS: meal prep, cleaning, laundry, driving, and yard work  PERSONAL FACTORS: Time since onset of injury/illness/exacerbation and 1 comorbidity: prior knee surgery  are also affecting patient's functional outcome.   REHAB POTENTIAL: Excellent  CLINICAL DECISION MAKING: Stable/uncomplicated  EVALUATION COMPLEXITY: Low   GOALS:  SHORT TERM GOALS: Target date: 01/19/23  Ind with an initial HEP. Goal status: INITIAL  2.  Full active left knee extension.  Goal status: INITIAL  3.  Active left knee flexion to 100 degrees.  Goal status: INITIAL   LONG TERM GOALS: Target date: 02/16/23.  Ind with an advanced HEP.  Goal status: INITIAL  2.  Active left knee flexion to 115 degrees+ so the patient can perform functional tasks and do so with pain not > 2-3/10.  Goal status:  INITIAL  3.  Increase left hip and knee strength to a solid 4+/5 to provide good stability for accomplishment of functional activities.  Goal status: INITIAL  4.  Perform a reciprocating stair gait with one railing with pain not > 2-3/10.  Goal status: INITIAL  5.  Perform ADL's with left knee pain not > 2-3/10.  Goal status: INITIAL    PLAN:  PT FREQUENCY: 2x/week  PT DURATION: 6 weeks  PLANNED INTERVENTIONS: 97110-Therapeutic exercises, 97530- Therapeutic activity, [id number]- Neuromuscular re-education, 97535- Self Care, [id number]- Manual therapy, 97014- Electrical stimulation (unattended), 97016- Vasopneumatic device, Patient/Family education, Stair training, Cryotherapy, and Moist heat  PLAN FOR NEXT SESSION: Nustep with progress to recumbent bike.  Progress into TKA protocol.  LE elevation and vasopneumatic.   Anais Denslow,CHRIS, PTA 01/15/2023, 11:35 AM

## 2023-01-19 ENCOUNTER — Ambulatory Visit: Payer: 59

## 2023-01-19 DIAGNOSIS — R600 Localized edema: Secondary | ICD-10-CM

## 2023-01-19 DIAGNOSIS — M25562 Pain in left knee: Secondary | ICD-10-CM | POA: Diagnosis not present

## 2023-01-19 DIAGNOSIS — M6281 Muscle weakness (generalized): Secondary | ICD-10-CM

## 2023-01-19 DIAGNOSIS — G8929 Other chronic pain: Secondary | ICD-10-CM

## 2023-01-19 DIAGNOSIS — M25662 Stiffness of left knee, not elsewhere classified: Secondary | ICD-10-CM

## 2023-01-19 NOTE — Therapy (Signed)
 OUTPATIENT PHYSICAL THERAPY LOWER EXTREMITY TREATMENT   Patient Name: Jacob Braun MRN: [id number] DOB:[birth year], 63 y.o., male Today's Date: 01/19/2023  END OF SESSION:  PT End of Session - 01/19/23 1025     Visit Number 5    Number of Visits 12    Date for PT Re-Evaluation 02/16/23    PT Start Time 1024    PT Stop Time 1025    PT Time Calculation (min) 1 min              Past Medical History:  Diagnosis Date   Gallbladder mass    Hemorrhoids    Hyperlipidemia    Medical history non-contributory    Subarchnoid hemorrhage-brief coma    Past Surgical History:  Procedure Laterality Date   CARPAL TUNNEL RELEASE  2008 & 2009   CHEST SURGERY  1983   exploratory surgery    COLONOSCOPY  10/30/2011   Procedure: COLONOSCOPY;  Surgeon: Claudis RAYMOND Rivet, MD;  Location: [facility];  Service: Endoscopy;  Laterality: N/A;  1030   COLONOSCOPY N/A 08/17/2015   Procedure: COLONOSCOPY;  Surgeon: Claudis RAYMOND Rivet, MD;  [facility]: [facility];  Service: Endoscopy;  Laterality: N/A;  2:10   KNEE CARTILAGE SURGERY  2006   Right   RADIOLOGY WITH ANESTHESIA N/A 02/14/2014   Procedure: Aneurysm Coiling;  Surgeon: Gerldine Maizes, MD;  [facility];  Service: Radiology;  Laterality: N/A;   ULNAR TUNNEL RELEASE  2009   Left   VARICOCELECTOMY  1992   VASECTOMY  1999   Patient Active Problem List   Diagnosis Date Noted   Loss of weight 08/06/2015   Rectal discharge 08/06/2015   Abnormal CT scan, colon 08/06/2015   SAH (subarachnoid hemorrhage) (HCC) 02/13/2014   Annual physical exam 12/27/2012   Hemorrhoids    Gallbladder mass    Hyperlipidemia    Pigmented nevus 03/30/2010    REFERRING PROVIDER: Toribio Nearing MD  REFERRING DIAG: S/p left partial knee replacement  THERAPY DIAG:  Chronic pain of left knee  Stiffness of left knee, not elsewhere classified  Localized edema  Muscle weakness (generalized)  Rationale for Evaluation and Treatment:  Rehabilitation  ONSET DATE: 01/02/23 (surgery date).    SUBJECTIVE:   SUBJECTIVE STATEMENT: Patient denies any pain today.  Pt reports that MD is pleased with progress.    PERTINENT HISTORY: Previous left knee scope. PAIN:  Are you having pain? No  PRECAUTIONS: Other: No ultrasound.  WEIGHT BEARING RESTRICTIONS: No  FALLS:  Has patient fallen in last 6 months? No  LIVING ENVIRONMENT: Lives with: lives with their spouse Lives in: House/apartment Has following equipment at home: Vannie - 2 wheeled  OCCUPATION: Retired.  PLOF: Independent  PATIENT GOALS: Perform ADL's without left knee pain.   OBJECTIVE:  Note: Objective measures were completed at Evaluation unless otherwise noted.  EDEMA:  Circumferential: 7 cms greater on left than right.   PALPATION: Diffuse left knee pain currently.  LOWER EXTREMITY ROM:  In supine:  Left knee extension to -10 degrees with active flexion to 70 degrees and gentle passive to 80 degrees.    LOWER EXTREMITY MMT:  Patient currently unable to perform an antigravity left SLR.  Left knee extension graded grossly at 3-/5.  GAIT: The patient  walking safely with a FWW with a decrease in step and stride length.  PATIENT EDUCATION:  Education details: Patient performing HEP as established in hospital. Person educated: Patient Education method: Explanation Education comprehension: verbalized understanding  HOME  EXERCISE PROGRAM: As above.  TODAY'S TREATMENT  Today's Date                        01-19-23        EXERCISE LOG     LT Knee  Exercise Repetitions and Resistance Comments  Nustep  L2 x 20  minutes @ seat 10-8 Flexion progression  Rocker board  4 mins   Heel ps and toe ups 25 reps each   6in step up 25 reps   UE assist  Lunges onto step  14 box x 3 mins LLE leading  Supine quad set   LLE only   SAQ    LAQs 3# x 25 reps    SLR  15  reps    Supine heel slide Knee glide x 3 mins    Blank cell = exercise not  performed today   Modalities: no redness or adverse reaction to today's modalities  Date:  Vaso: Knee, 34 degrees; low pressure, 15 mins, Pain and Edema  ASSESSMENT:  CLINICAL IMPRESSION: Pt arrives for today's treatment session denying any pain.  Pt able to increase FOTO score to 61 today.  Pt able to tolerate increased time or reps with all exercises performed today.  Pt able to demonstrate 112 degrees of knee flexion today.  Pt able to perform SLR today without AA.  Normal responses to vaso noted upon removal.  Pt denied any pain at completion of today's treatment session.   OBJECTIVE IMPAIRMENTS: Abnormal gait, decreased activity tolerance, decreased ROM, decreased strength, increased edema, and pain.   ACTIVITY LIMITATIONS: carrying, lifting, bending, stairs, transfers, bed mobility, and locomotion level  PARTICIPATION LIMITATIONS: meal prep, cleaning, laundry, driving, and yard work  PERSONAL FACTORS: Time since onset of injury/illness/exacerbation and 1 comorbidity: prior knee surgery  are also affecting patient's functional outcome.   REHAB POTENTIAL: Excellent  CLINICAL DECISION MAKING: Stable/uncomplicated  EVALUATION COMPLEXITY: Low   GOALS:  SHORT TERM GOALS: Target date: 01/19/23  Ind with an initial HEP. Goal status: MET  2.  Full active left knee extension.  Goal status: MET  3.  Active left knee flexion to 100 degrees.  Goal status: MET   LONG TERM GOALS: Target date: 02/16/23.  Ind with an advanced HEP.  Goal status: IN PROGRESS  2.  Active left knee flexion to 115 degrees+ so the patient can perform functional tasks and do so with pain not > 2-3/10.  Goal status: IN PROGRESS  3.  Increase left hip and knee strength to a solid 4+/5 to provide good stability for accomplishment of functional activities.  Goal status: IN PROGRESS  4.  Perform a reciprocating stair gait with one railing with pain not > 2-3/10.  Goal status: IN PROGRESS  5.  Perform  ADL's with left knee pain not > 2-3/10.  Goal status: IN PROGRESS   PLAN:  PT FREQUENCY: 2x/week  PT DURATION: 6 weeks  PLANNED INTERVENTIONS: 97110-Therapeutic exercises, 97530- Therapeutic activity, [id number]- Neuromuscular re-education, 97535- Self Care, 02859- Manual therapy, 97014- Electrical stimulation (unattended), 97016- Vasopneumatic device, Patient/Family education, Stair training, Cryotherapy, and Moist heat  PLAN FOR NEXT SESSION: Nustep with progress to recumbent bike.  Progress into TKA protocol.  LE elevation and vasopneumatic.   Delon DELENA Gosling, PTA 01/19/2023, 11:29 AM

## 2023-01-21 MED ORDER — PEG 3350-KCL-NA BICARB-NACL 420 G PO SOLR: 4000.0000 mL | Freq: Once | ORAL | 0 refills | Status: AC

## 2023-01-21 NOTE — Telephone Encounter (Signed)
 Pt contacted. Pt scheduled for 03/03/23. Instructions will be sent via mail once telephone pre op has been received. Prep sent to pharmacy. No PA needed via insurance.

## 2023-01-21 NOTE — Addendum Note (Signed)
 Addended by: Marlowe Shores on: 01/21/2023 03:55 PM   Modules accepted: Orders

## 2023-01-22 ENCOUNTER — Ambulatory Visit: Payer: 59 | Admitting: *Deleted

## 2023-01-22 ENCOUNTER — Encounter: Payer: Self-pay | Admitting: *Deleted

## 2023-01-22 DIAGNOSIS — M25662 Stiffness of left knee, not elsewhere classified: Secondary | ICD-10-CM

## 2023-01-22 DIAGNOSIS — R600 Localized edema: Secondary | ICD-10-CM

## 2023-01-22 DIAGNOSIS — G8929 Other chronic pain: Secondary | ICD-10-CM

## 2023-01-22 DIAGNOSIS — M25562 Pain in left knee: Secondary | ICD-10-CM | POA: Diagnosis not present

## 2023-01-22 DIAGNOSIS — M6281 Muscle weakness (generalized): Secondary | ICD-10-CM

## 2023-01-22 NOTE — Therapy (Signed)
 OUTPATIENT PHYSICAL THERAPY LOWER EXTREMITY TREATMENT   Patient Name: Jacob Braun MRN: [id number] DOB:1961/01/07, 63 y.o., male Today's Date: 01/22/2023  END OF SESSION:  PT End of Session - 01/22/23 1033     Visit Number 6    Number of Visits 12    Date for PT Re-Evaluation 02/16/23    PT Start Time 1015    PT Stop Time 1115    PT Time Calculation (min) 60 min              Past Medical History:  Diagnosis Date   Gallbladder mass    Hemorrhoids    Hyperlipidemia    Medical history non-contributory    Subarchnoid hemorrhage-brief coma    Past Surgical History:  Procedure Laterality Date   CARPAL TUNNEL RELEASE  2008 & 2009   CHEST SURGERY  1983   exploratory surgery    COLONOSCOPY  10/30/2011   Procedure: COLONOSCOPY;  Surgeon: Claudis RAYMOND Rivet, MD;  Location: [facility];  Service: Endoscopy;  Laterality: N/A;  1030   COLONOSCOPY N/A 08/17/2015   Procedure: COLONOSCOPY;  Surgeon: Claudis RAYMOND Rivet, MD;  Location: [facility];  Service: Endoscopy;  Laterality: N/A;  2:10   KNEE CARTILAGE SURGERY  2006   Right   RADIOLOGY WITH ANESTHESIA N/A 02/14/2014   Procedure: Aneurysm Coiling;  Surgeon: Gerldine Maizes, MD;  Location: [facility] OR;  Service: Radiology;  Laterality: N/A;   ULNAR TUNNEL RELEASE  2009   Left   VARICOCELECTOMY  1992   VASECTOMY  1999   Patient Active Problem List   Diagnosis Date Noted   Loss of weight 08/06/2015   Rectal discharge 08/06/2015   Abnormal CT scan, colon 08/06/2015   SAH (subarachnoid hemorrhage) (HCC) 02/13/2014   Annual physical exam 12/27/2012   Hemorrhoids    Gallbladder mass    Hyperlipidemia    Pigmented nevus 03/30/2010    REFERRING PROVIDER: Toribio Nearing MD  REFERRING DIAG: S/p left partial knee replacement  THERAPY DIAG:  Chronic pain of left knee  Stiffness of left knee, not elsewhere classified  Localized edema  Muscle weakness (generalized)  Rationale for Evaluation and Treatment:  Rehabilitation  ONSET DATE: 01/02/23 (surgery date).    SUBJECTIVE:   SUBJECTIVE STATEMENT: Patient reports LT doing better. Steps getting easier   PERTINENT HISTORY: Previous left knee scope. PAIN:  Are you having pain? No  PRECAUTIONS: Other: No ultrasound.  WEIGHT BEARING RESTRICTIONS: No  FALLS:  Has patient fallen in last 6 months? No  LIVING ENVIRONMENT: Lives with: lives with their spouse Lives in: House/apartment Has following equipment at home: Vannie - 2 wheeled  OCCUPATION: Retired.  PLOF: Independent  PATIENT GOALS: Perform ADL's without left knee pain.   OBJECTIVE:  Note: Objective measures were completed at Evaluation unless otherwise noted.  EDEMA:  Circumferential: 7 cms greater on left than right.   PALPATION: Diffuse left knee pain currently.  LOWER EXTREMITY ROM:  In supine:  Left knee extension to -10 degrees with active flexion to 70 degrees and gentle passive to 80 degrees.    LOWER EXTREMITY MMT:  Patient currently unable to perform an antigravity left SLR.  Left knee extension graded grossly at 3-/5.  GAIT: The patient  walking safely with a FWW with a decrease in step and stride length.  PATIENT EDUCATION:  Education details: Patient performing HEP as established in hospital. Person educated: Patient Education method: Explanation Education comprehension: verbalized understanding  HOME EXERCISE PROGRAM: As above.  TODAY'S TREATMENT  Today's Date                        01-22-23        EXERCISE LOG     LT Knee  Exercise Repetitions and Resistance Comments  Nustep  L2 x 15 minutes @ seat 10-8 Flexion progression  Rocker board  4 mins   Heel ups and toe ups    6in step up   forward/ side stepping 3x10   each way   UE assist  Lunges onto step  14 box 3  x10   LLE leading  Supine quad set   LLE only   SAQ    LAQs 3# x   30.reps pause at top   SLR     Supine heel slide    HS curl Green tband 2x10    Blank cell = exercise  not performed today  Manual STW to LT knee posterior aspect ans HS x 6 mins Modalities: no redness or adverse reaction to today's modalities  Date:  Vaso: Knee, 34 degrees; low pressure, 15 mins, Pain and Edema  ASSESSMENT:  CLINICAL IMPRESSION: Pt arrives for today's treatment with mainly LT knee soreness/ tightness. Pt able to continue with progression of OKC and CKC exs without complaints. Flexion PROM to 112 degrees today and extension to 0 degrees. Normal responses to vaso noted upon removal.  Pt denied any pain at completion of today's treatment session.   OBJECTIVE IMPAIRMENTS: Abnormal gait, decreased activity tolerance, decreased ROM, decreased strength, increased edema, and pain.   ACTIVITY LIMITATIONS: carrying, lifting, bending, stairs, transfers, bed mobility, and locomotion level  PARTICIPATION LIMITATIONS: meal prep, cleaning, laundry, driving, and yard work  PERSONAL FACTORS: Time since onset of injury/illness/exacerbation and 1 comorbidity: prior knee surgery  are also affecting patient's functional outcome.   REHAB POTENTIAL: Excellent  CLINICAL DECISION MAKING: Stable/uncomplicated  EVALUATION COMPLEXITY: Low   GOALS:  SHORT TERM GOALS: Target date: 01/19/23  Ind with an initial HEP. Goal status: MET  2.  Full active left knee extension.  Goal status: MET  3.  Active left knee flexion to 100 degrees.  Goal status: MET   LONG TERM GOALS: Target date: 02/16/23.  Ind with an advanced HEP.  Goal status: IN PROGRESS  2.  Active left knee flexion to 115 degrees+ so the patient can perform functional tasks and do so with pain not > 2-3/10.  Goal status: IN PROGRESS  3.  Increase left hip and knee strength to a solid 4+/5 to provide good stability for accomplishment of functional activities.  Goal status: IN PROGRESS  4.  Perform a reciprocating stair gait with one railing with pain not > 2-3/10.  Goal status: IN PROGRESS  5.  Perform ADL's with left  knee pain not > 2-3/10.  Goal status: IN PROGRESS   PLAN:  PT FREQUENCY: 2x/week  PT DURATION: 6 weeks  PLANNED INTERVENTIONS: 97110-Therapeutic exercises, 97530- Therapeutic activity, [id number]- Neuromuscular re-education, 97535- Self Care, [id number]- Manual therapy, 97014- Electrical stimulation (unattended), 97016- Vasopneumatic device, Patient/Family education, Stair training, Cryotherapy, and Moist heat  PLAN FOR NEXT SESSION: Nustep with progress to recumbent bike.  Progress into TKA protocol.  LE elevation and vasopneumatic.   Felicie Kocher,CHRIS, PTA 01/22/2023, 11:43 AM

## 2023-01-23 NOTE — Telephone Encounter (Signed)
 Questionnaire from recall, no referral needed

## 2023-01-26 ENCOUNTER — Telehealth (INDEPENDENT_AMBULATORY_CARE_PROVIDER_SITE_OTHER): Payer: Self-pay | Admitting: Gastroenterology

## 2023-01-26 ENCOUNTER — Ambulatory Visit: Payer: 59

## 2023-01-26 DIAGNOSIS — G8929 Other chronic pain: Secondary | ICD-10-CM

## 2023-01-26 DIAGNOSIS — M25562 Pain in left knee: Secondary | ICD-10-CM | POA: Diagnosis not present

## 2023-01-26 DIAGNOSIS — M25662 Stiffness of left knee, not elsewhere classified: Secondary | ICD-10-CM

## 2023-01-26 DIAGNOSIS — R600 Localized edema: Secondary | ICD-10-CM

## 2023-01-26 DIAGNOSIS — M6281 Muscle weakness (generalized): Secondary | ICD-10-CM

## 2023-01-26 NOTE — Therapy (Signed)
 OUTPATIENT PHYSICAL THERAPY LOWER EXTREMITY TREATMENT   Patient Name: Jacob Braun MRN: [id number] DOB:[birth year], 63 y.o., male Today's Date: 01/26/2023  END OF SESSION:  PT End of Session - 01/26/23 1022     Visit Number 7    Number of Visits 12    Date for PT Re-Evaluation 02/16/23    PT Start Time 1020    PT Stop Time 1113    PT Time Calculation (min) 53 min              Past Medical History:  Diagnosis Date   Gallbladder mass    Hemorrhoids    Hyperlipidemia    Medical history non-contributory    Subarchnoid hemorrhage-brief coma    Past Surgical History:  Procedure Laterality Date   CARPAL TUNNEL RELEASE  2008 & 2009   CHEST SURGERY  1983   exploratory surgery    COLONOSCOPY  10/30/2011   Procedure: COLONOSCOPY;  Surgeon: Claudis RAYMOND Rivet, MD;  Location: [facility];  Service: Endoscopy;  Laterality: N/A;  1030   COLONOSCOPY N/A 08/17/2015   Procedure: COLONOSCOPY;  Surgeon: Claudis RAYMOND Rivet, MD;  Location: [facility];  Service: Endoscopy;  Laterality: N/A;  2:10   KNEE CARTILAGE SURGERY  2006   Right   RADIOLOGY WITH ANESTHESIA N/A 02/14/2014   Procedure: Aneurysm Coiling;  Surgeon: Gerldine Maizes, MD;  Location: [facility] OR;  Service: Radiology;  Laterality: N/A;   ULNAR TUNNEL RELEASE  2009   Left   VARICOCELECTOMY  1992   VASECTOMY  1999   Patient Active Problem List   Diagnosis Date Noted   Loss of weight 08/06/2015   Rectal discharge 08/06/2015   Abnormal CT scan, colon 08/06/2015   SAH (subarachnoid hemorrhage) (HCC) 02/13/2014   Annual physical exam 12/27/2012   Hemorrhoids    Gallbladder mass    Hyperlipidemia    Pigmented nevus 03/30/2010    REFERRING PROVIDER: Toribio Nearing MD  REFERRING DIAG: S/p left partial knee replacement  THERAPY DIAG:  Chronic pain of left knee  Stiffness of left knee, not elsewhere classified  Localized edema  Muscle weakness (generalized)  Rationale for Evaluation and Treatment:  Rehabilitation  ONSET DATE: 01/02/23 (surgery date).    SUBJECTIVE:   SUBJECTIVE STATEMENT: Patient reports LT doing better. Steps getting easier   PERTINENT HISTORY: Previous left knee scope. PAIN:  Are you having pain? No  PRECAUTIONS: Other: No ultrasound.  WEIGHT BEARING RESTRICTIONS: No  FALLS:  Has patient fallen in last 6 months? No  LIVING ENVIRONMENT: Lives with: lives with their spouse Lives in: House/apartment Has following equipment at home: Vannie - 2 wheeled  OCCUPATION: Retired.  PLOF: Independent  PATIENT GOALS: Perform ADL's without left knee pain.   OBJECTIVE:  Note: Objective measures were completed at Evaluation unless otherwise noted.  EDEMA:  Circumferential: 7 cms greater on left than right.   PALPATION: Diffuse left knee pain currently.  LOWER EXTREMITY ROM:  In supine:  Left knee extension to -10 degrees with active flexion to 70 degrees and gentle passive to 80 degrees.    LOWER EXTREMITY MMT:  Patient currently unable to perform an antigravity left SLR.  Left knee extension graded grossly at 3-/5.  GAIT: The patient  walking safely with a FWW with a decrease in step and stride length.  PATIENT EDUCATION:  Education details: Patient performing HEP as established in hospital. Person educated: Patient Education method: Explanation Education comprehension: verbalized understanding  HOME EXERCISE PROGRAM: As above.  TODAY'S TREATMENT  Today's Date                        01-26-23        EXERCISE LOG     LT Knee  Exercise Repetitions and Resistance Comments  Nustep  L2 x 15 minutes @ seat 9-7 Flexion progression  Rocker board  5 mins   Heel ups and toe ups    Forward Step Ups BOSU (ball up) x 25 reps   UE assist  Lateral Step Ups BOSU (ball up) x 25 reps   Lunges onto step  14 x 3 mins LLE leading  Supine quad set   LLE only   SAQ    LAQs 4# x 25 reps pause at top   SLR  3# x 30 reps   Supine heel slide    HS curl  Green tband 2x10    Blank cell = exercise not performed today   Modalities: no redness or adverse reaction to today's modalities  Date:  Vaso: Knee, 34 degrees; low pressure, 15 mins, Pain and Edema  ASSESSMENT:  CLINICAL IMPRESSION: Pt arrives for today's treatment session denying any pain.   Pt able to tolerate increased time with rockerboard and lunges on 14 box today.  Pt progressed to forward and lateral step up onto BOSU with ball up.  Pt able to tolerate increased weight with both LAQs and SLR today with good results.  Normal responses to vaso noted upon removal.  Pt denied any pain at completion of today's treatment session.   OBJECTIVE IMPAIRMENTS: Abnormal gait, decreased activity tolerance, decreased ROM, decreased strength, increased edema, and pain.   ACTIVITY LIMITATIONS: carrying, lifting, bending, stairs, transfers, bed mobility, and locomotion level  PARTICIPATION LIMITATIONS: meal prep, cleaning, laundry, driving, and yard work  PERSONAL FACTORS: Time since onset of injury/illness/exacerbation and 1 comorbidity: prior knee surgery  are also affecting patient's functional outcome.   REHAB POTENTIAL: Excellent  CLINICAL DECISION MAKING: Stable/uncomplicated  EVALUATION COMPLEXITY: Low   GOALS:  SHORT TERM GOALS: Target date: 01/19/23  Ind with an initial HEP. Goal status: MET  2.  Full active left knee extension.  Goal status: MET  3.  Active left knee flexion to 100 degrees.  Goal status: MET   LONG TERM GOALS: Target date: 02/16/23.  Ind with an advanced HEP.  Goal status: IN PROGRESS  2.  Active left knee flexion to 115 degrees+ so the patient can perform functional tasks and do so with pain not > 2-3/10.  Goal status: IN PROGRESS  3.  Increase left hip and knee strength to a solid 4+/5 to provide good stability for accomplishment of functional activities.  Goal status: IN PROGRESS  4.  Perform a reciprocating stair gait with one railing with pain  not > 2-3/10.  Goal status: IN PROGRESS  5.  Perform ADL's with left knee pain not > 2-3/10.  Goal status: IN PROGRESS   PLAN:  PT FREQUENCY: 2x/week  PT DURATION: 6 weeks  PLANNED INTERVENTIONS: 97110-Therapeutic exercises, 97530- Therapeutic activity, [id number]- Neuromuscular re-education, 97535- Self Care, 02859- Manual therapy, 97014- Electrical stimulation (unattended), 97016- Vasopneumatic device, Patient/Family education, Stair training, Cryotherapy, and Moist heat  PLAN FOR NEXT SESSION: Nustep with progress to recumbent bike.  Progress into TKA protocol.  LE elevation and vasopneumatic.   Delon DELENA Gosling, PTA 01/26/2023, 11:13 AM

## 2023-01-26 NOTE — Telephone Encounter (Signed)
 Pt left voicemail in regards to wanting to reschedule TCS for April. Returned call to patient. Advised pt we do not have April schedule at this time. Once I receive April schedule I will give him call. Pt verbalized understanding.

## 2023-01-28 ENCOUNTER — Ambulatory Visit: Payer: 59

## 2023-01-28 DIAGNOSIS — R600 Localized edema: Secondary | ICD-10-CM

## 2023-01-28 DIAGNOSIS — M25562 Pain in left knee: Secondary | ICD-10-CM | POA: Diagnosis not present

## 2023-01-28 DIAGNOSIS — G8929 Other chronic pain: Secondary | ICD-10-CM

## 2023-01-28 DIAGNOSIS — M25662 Stiffness of left knee, not elsewhere classified: Secondary | ICD-10-CM

## 2023-01-28 DIAGNOSIS — M6281 Muscle weakness (generalized): Secondary | ICD-10-CM

## 2023-01-28 NOTE — Therapy (Signed)
 OUTPATIENT PHYSICAL THERAPY LOWER EXTREMITY TREATMENT   Patient Name: Jacob Braun MRN: [id number] DOB:1960-04-14, 63 y.o., male Today's Date: 01/28/2023  END OF SESSION:  PT End of Session - 01/28/23 0806     Visit Number 8    Number of Visits 12    Date for PT Re-Evaluation 02/16/23    PT Start Time 0802    PT Stop Time 0908    PT Time Calculation (min) 66 min    Activity Tolerance Patient tolerated treatment well    Behavior During Therapy [facility] for tasks assessed/performed               Past Medical History:  Diagnosis Date   Gallbladder mass    Hemorrhoids    Hyperlipidemia    Medical history non-contributory    Subarchnoid hemorrhage-brief coma    Past Surgical History:  Procedure Laterality Date   CARPAL TUNNEL RELEASE  2008 & 2009   CHEST SURGERY  1983   exploratory surgery    COLONOSCOPY  10/30/2011   Procedure: COLONOSCOPY;  Surgeon: Ruby Corporal, MD;  [facility]: [facility];  Service: Endoscopy;  Laterality: N/A;  1030   COLONOSCOPY N/A 08/17/2015   Procedure: COLONOSCOPY;  Surgeon: Ruby Corporal, MD;  Location: [facility];  Service: Endoscopy;  Laterality: N/A;  2:10   KNEE CARTILAGE SURGERY  2006   Right   RADIOLOGY WITH ANESTHESIA N/A 02/14/2014   Procedure: Aneurysm Coiling;  Surgeon: Augusto Blonder, MD;  Location: [facility] OR;  Service: Radiology;  Laterality: N/A;   ULNAR TUNNEL RELEASE  2009   Left   VARICOCELECTOMY  1992   VASECTOMY  1999   Patient Active Problem List   Diagnosis Date Noted   Loss of weight 08/06/2015   Rectal discharge 08/06/2015   Abnormal CT scan, colon 08/06/2015   SAH (subarachnoid hemorrhage) (HCC) 02/13/2014   Annual physical exam 12/27/2012   Hemorrhoids    Gallbladder mass    Hyperlipidemia    Pigmented nevus 03/30/2010    REFERRING PROVIDER: Araceli Knight MD  REFERRING DIAG: S/p left partial knee replacement  THERAPY DIAG:  Chronic pain of left knee  Stiffness of left knee, not elsewhere  classified  Localized edema  Muscle weakness (generalized)  Rationale for Evaluation and Treatment: Rehabilitation  ONSET DATE: 01/02/23 (surgery date).    SUBJECTIVE:   SUBJECTIVE STATEMENT: Patient reports that his knee is not hurting today, but it is stiff. He follows up with his surgeon on 02/11/23 or 02/12/23. He feels like he is about 60% back to where he wants to be with his knee. He will be out of town for about 2 weeks visiting his daughter in [place] .   PERTINENT HISTORY: Previous left knee scope. PAIN:  Are you having pain? No  PRECAUTIONS: Other: No ultrasound.  WEIGHT BEARING RESTRICTIONS: No  FALLS:  Has patient fallen in last 6 months? No  LIVING ENVIRONMENT: Lives with: lives with their spouse Lives in: House/apartment Has following equipment at home: Otho Blitz - 2 wheeled  OCCUPATION: Retired.  PLOF: Independent  PATIENT GOALS: Perform ADL's without left knee pain.   OBJECTIVE:  Note: Objective measures were completed at Evaluation unless otherwise noted.  EDEMA:  Circumferential: 7 cms greater on left than right.   PALPATION: Diffuse left knee pain currently.  LOWER EXTREMITY ROM:  In supine:  Left knee extension to -10 degrees with active flexion to 70 degrees and gentle passive to 80 degrees.    LOWER EXTREMITY MMT:  Patient currently unable to perform an antigravity left SLR.  Left knee extension graded grossly at 3-/5.  GAIT: The patient  walking safely with a FWW with a decrease in step and stride length.  PATIENT EDUCATION:  Education details: Patient performing HEP as established in hospital. Person educated: Patient Education method: Explanation Education comprehension: verbalized understanding  HOME EXERCISE PROGRAM: [id number]  TODAY'S TREATMENT                                   01/28/23 EXERCISE LOG  Exercise Repetitions and Resistance Comments  Nustep  L4 x 11 minutes; 8-7   Recumbent bike  L3 x 6 minutes   Runners  stretch  3 x 30 seconds each  For gastrocnemius and soleus  Sit to stand  20 reps  With staggered stance  Cybex knee flexion  30# x 25 reps    Cybex knee extension 10# x 2 minutes   Cybex leg press  1.5 plates x 25 reps @ seat 8   Eccentric heel tap  6" step x 25 reps  LLE on step   Thomas stretch  3 x 30 seconds   AROM measurements Extension: 4 degrees Flexion: 115 degrees LLE only    Blank cell = exercise not performed today  Modalities: no redness or adverse reaction to today's modalities  Date:  Vaso: Knee, 34 degrees; low pressure, 15 mins, Pain and Edema   Today's Date                        01-26-23        EXERCISE LOG     LT Knee  Exercise Repetitions and Resistance Comments  Nustep  L2 x 15 minutes @ seat 9-7 Flexion progression  Rocker board  5 mins   Heel ups and toe ups    Forward Step Ups BOSU (ball up) x 25 reps   UE assist  Lateral Step Ups BOSU (ball up) x 25 reps   Lunges onto step  14" x 3 mins LLE leading  Supine quad set   LLE only   SAQ    LAQs 4# x 25 reps pause at top   SLR  3# x 30 reps   Supine heel slide    HS curl Green tband 2x10    Blank cell = exercise not performed today   Modalities: no redness or adverse reaction to today's modalities  Date:  Vaso: Knee, 34 degrees; low pressure, 15 mins, Pain and Edema  ASSESSMENT:  CLINICAL IMPRESSION: Patient is making good progress with skilled physical therapy as evidenced by his subjective reports, objective measures, functional mobility, and progress toward his goals. He was able to demonstrate improved left knee active range of motion since his initial evaluation. However, he continues to exhibit reduced muscular strength. His HEP was updated with today's interventions as he was able to complete these interventions with minimal cueing. He reported feeling comfortable with these interventions. He reported that his knee felt good upon the conclusion of treatment. Recommend that he continue with skilled  physical therapy to address his remaining impairments to return to his prior level of function.   OBJECTIVE IMPAIRMENTS: Abnormal gait, decreased activity tolerance, decreased ROM, decreased strength, increased edema, and pain.   ACTIVITY LIMITATIONS: carrying, lifting, bending, stairs, transfers, bed mobility, and locomotion level  PARTICIPATION LIMITATIONS: meal prep, cleaning, laundry, driving, and yard work  PERSONAL FACTORS: Time since onset of injury/illness/exacerbation and 1 comorbidity: prior knee surgery  are also affecting patient's functional outcome.   REHAB POTENTIAL: Excellent  CLINICAL DECISION MAKING: Stable/uncomplicated  EVALUATION COMPLEXITY: Low   GOALS:  SHORT TERM GOALS: Target date: 01/19/23  Ind with an initial HEP. Goal status: MET  2.  Full active left knee extension.  Goal status: MET  3.  Active left knee flexion to 100 degrees.  Goal status: MET   LONG TERM GOALS: Target date: 02/16/23.  Ind with an advanced HEP.  Goal status: IN PROGRESS  2.  Active left knee flexion to 115 degrees+ so the patient can perform functional tasks and do so with pain not > 2-3/10.   Baseline:  Goal status: MET  3.  Increase left hip and knee strength to a solid 4+/5 to provide good stability for accomplishment of functional activities.  Goal status: IN PROGRESS  4.  Perform a reciprocating stair gait with one railing with pain not > 2-3/10.  Goal status: IN PROGRESS  5.  Perform ADL's with left knee pain not > 2-3/10.  Goal status: MET   PLAN:  PT FREQUENCY: 2x/week  PT DURATION: 6 weeks  PLANNED INTERVENTIONS: 97110-Therapeutic exercises, 97530- Therapeutic activity, [id number]- Neuromuscular re-education, 97535- Self Care, 29528- Manual therapy, 97014- Electrical stimulation (unattended), 97016- Vasopneumatic device, Patient/Family education, Stair training, Cryotherapy, and Moist heat  PLAN FOR NEXT SESSION: Nustep with progress to recumbent bike.   Progress into TKA protocol.  LE elevation and vasopneumatic.   Lane Pinon, PT 01/28/2023, 9:13 AM

## 2023-02-02 ENCOUNTER — Encounter: Payer: 59 | Admitting: *Deleted

## 2023-02-09 ENCOUNTER — Encounter: Payer: 59 | Admitting: *Deleted

## 2023-02-12 ENCOUNTER — Ambulatory Visit: Payer: 59 | Admitting: *Deleted

## 2023-02-12 ENCOUNTER — Encounter: Payer: Self-pay | Admitting: *Deleted

## 2023-02-12 DIAGNOSIS — M25662 Stiffness of left knee, not elsewhere classified: Secondary | ICD-10-CM

## 2023-02-12 DIAGNOSIS — G8929 Other chronic pain: Secondary | ICD-10-CM

## 2023-02-12 DIAGNOSIS — M25562 Pain in left knee: Secondary | ICD-10-CM | POA: Diagnosis not present

## 2023-02-12 DIAGNOSIS — R600 Localized edema: Secondary | ICD-10-CM

## 2023-02-12 DIAGNOSIS — M6281 Muscle weakness (generalized): Secondary | ICD-10-CM

## 2023-02-12 NOTE — Therapy (Signed)
OUTPATIENT PHYSICAL THERAPY LOWER EXTREMITY TREATMENT   Patient Name: Jacob Braun MRN: [id number] DOB:Sep 17, 1960, 63 y.o., male Today's Date: 02/12/2023  END OF SESSION:  PT End of Session - 02/12/23 1015     Visit Number 9    Number of Visits 12    Date for PT Re-Evaluation 02/16/23    PT Start Time 1015    PT Stop Time 1115    PT Time Calculation (min) 60 min               Past Medical History:  Diagnosis Date   Gallbladder mass    Hemorrhoids    Hyperlipidemia    Medical history non-contributory    Subarchnoid hemorrhage-brief coma    Past Surgical History:  Procedure Laterality Date   CARPAL TUNNEL RELEASE  2008 & 2009   CHEST SURGERY  1983   exploratory surgery    COLONOSCOPY  10/30/2011   Procedure: COLONOSCOPY;  Surgeon: Malissa Hippo, MD;  Location: [facility];  Service: Endoscopy;  Laterality: N/A;  1030   COLONOSCOPY N/A 08/17/2015   Procedure: COLONOSCOPY;  Surgeon: Malissa Hippo, MD;  Location: [facility];  Service: Endoscopy;  Laterality: N/A;  2:10   KNEE CARTILAGE SURGERY  2006   Right   RADIOLOGY WITH ANESTHESIA N/A 02/14/2014   Procedure: Aneurysm Coiling;  Surgeon: Lisbeth Renshaw, MD;  Location[facility];  Service: Radiology;  Laterality: N/A;   ULNAR TUNNEL RELEASE  2009   Left   VARICOCELECTOMY  1992   VASECTOMY  1999   Patient Active Problem List   Diagnosis Date Noted   Loss of weight 08/06/2015   Rectal discharge 08/06/2015   Abnormal CT scan, colon 08/06/2015   SAH (subarachnoid hemorrhage) (HCC) 02/13/2014   Annual physical exam 12/27/2012   Hemorrhoids    Gallbladder mass    Hyperlipidemia    Pigmented nevus 03/30/2010    REFERRING PROVIDER: Georgina Pillion MD  REFERRING DIAG: S/p left partial knee replacement  THERAPY DIAG:  Chronic pain of left knee  Stiffness of left knee, not elsewhere classified  Localized edema  Muscle weakness (generalized)  Rationale for Evaluation and Treatment:  Rehabilitation  ONSET DATE: 01/02/23 (surgery date).    SUBJECTIVE:   SUBJECTIVE STATEMENT: Patient arrived today doing fairly well with LT knee  stiff/swollen. To MD today    PERTINENT HISTORY: Previous left knee scope. PAIN:  Are you having pain? 2-3/10  PRECAUTIONS: Other: No ultrasound.  WEIGHT BEARING RESTRICTIONS: No  FALLS:  Has patient fallen in last 6 months? No  LIVING ENVIRONMENT: Lives with: lives with their spouse Lives in: House/apartment Has following equipment at home: Dan Humphreys - 2 wheeled  OCCUPATION: Retired.  PLOF: Independent  PATIENT GOALS: Perform ADL's without left knee pain.   OBJECTIVE:  Note: Objective measures were completed at Evaluation unless otherwise noted.  EDEMA:  Circumferential: 7 cms greater on left than right.   PALPATION: Diffuse left knee pain currently.  LOWER EXTREMITY ROM:  In supine:  Left knee extension to -10 degrees with active flexion to 70 degrees and gentle passive to 80 degrees.    LOWER EXTREMITY MMT:  Patient currently unable to perform an antigravity left SLR.  Left knee extension graded grossly at 3-/5.  GAIT: The patient  walking safely with a FWW with a decrease in step and stride length.  PATIENT EDUCATION:  Education details: Patient performing HEP as established in hospital. Person educated: Patient Education method: Explanation Education comprehension: verbalized understanding  HOME EXERCISE PROGRAM: [id number]  TODAY'S TREATMENT                                       02/12/23 EXERCISE LOG     LT knee  Exercise Repetitions and Resistance Comments  Nustep    Recumbent bike  L3 x 15  minutes   Rocker board  5 mins total DF/PF and calf stretch   Runners stretch  3 For gastrocnemius and soleus  Sit to stand   With staggered stance  Cybex knee flexion  30# 3 x 30   Cybex knee extension 10# 3x30   Cybex leg press  1.5 plates x 25 reps @ seat 8   Eccentric heel tap   LLE on step   6in step up  and side X20 each   Thomas stretch     AROM measurements Extension: 4 degrees Flexion: 120degrees LLE only    Blank cell = exercise not performed today  Modalities: no redness or adverse reaction to today's modalities Manual PROM 0-130 degrees today Date:  Vaso: Knee, 34 degrees; low pressure, 15 mins, Pain and Edema   Today's Date                        01-26-23        EXERCISE LOG     LT Knee  Exercise Repetitions and Resistance Comments  Nustep  L2 x 15 minutes @ seat 9-7 Flexion progression  Rocker board  5 mins   Heel ups and toe ups    Forward Step Ups BOSU (ball up) x 25 reps   UE assist  Lateral Step Ups BOSU (ball up) x 25 reps   Lunges onto step  14" x 3 mins LLE leading  Supine quad set   LLE only   SAQ    LAQs 4# x 25 reps pause at top   SLR  3# x 30 reps   Supine heel slide    HS curl Green tband 2x10    Blank cell = exercise not performed today   Modalities: no redness or adverse reaction to today's modalities  Date:  Vaso: Knee, 34 degrees; low pressure, 15 mins, Pain and Edema  ASSESSMENT:  CLINICAL IMPRESSION:To MD today Patient has been OOT x 2 weeks and reports performing HEP and doing fairly well LT knee. Rx focused on ROM as well as strengthening progressions.  AROM LT knee 0-120 degrees and PROM to 130 degrees. Majority of LTG's met except strength due to strength deficits.     OBJECTIVE IMPAIRMENTS: Abnormal gait, decreased activity tolerance, decreased ROM, decreased strength, increased edema, and pain.   ACTIVITY LIMITATIONS: carrying, lifting, bending, stairs, transfers, bed mobility, and locomotion level  PARTICIPATION LIMITATIONS: meal prep, cleaning, laundry, driving, and yard work  PERSONAL FACTORS: Time since onset of injury/illness/exacerbation and 1 comorbidity: prior knee surgery  are also affecting patient's functional outcome.   REHAB POTENTIAL: Excellent  CLINICAL DECISION MAKING: Stable/uncomplicated  EVALUATION COMPLEXITY:  Low   GOALS:  SHORT TERM GOALS: Target date: 01/19/23  Ind with an initial HEP. Goal status: MET  2.  Full active left knee extension.  Goal status: MET  3.  Active left knee flexion to 100 degrees.  Goal status: MET   LONG TERM GOALS: Target date: 02/16/23.  Ind with an advanced HEP.  Goal status: IN PROGRESS  2.  Active left knee flexion to 115 degrees+ so the patient can perform functional tasks and do so with pain not > 2-3/10.   Baseline: 1/30: 130 degrees Goal status: MET  3.  Increase left hip and knee strength to a solid 4+/5 to provide good stability for accomplishment of functional activities.  Goal status: IN PROGRESS   4/5  4.  Perform a reciprocating stair gait with one railing with pain not > 2-3/10.    Baseline:  Goal status: MET  5.  Perform ADL's with left knee pain not > 2-3/10.  Goal status: MET   PLAN:  PT FREQUENCY: 2x/week  PT DURATION: 6 weeks  PLANNED INTERVENTIONS: 97110-Therapeutic exercises, 97530- Therapeutic activity, [id number]- Neuromuscular re-education, 97535- Self Care, 40981- Manual therapy, 97014- Electrical stimulation (unattended), 97016- Vasopneumatic device, Patient/Family education, Stair training, Cryotherapy, and Moist heat  PLAN FOR NEXT SESSION: Nustep with progress to recumbent bike.  Progress into TKA protocol.  LE elevation and vasopneumatic.   Tamya Denardo,CHRIS, PTA 02/12/2023, 11:56 AM

## 2023-02-16 ENCOUNTER — Encounter: Payer: 59 | Admitting: *Deleted

## 2023-02-19 ENCOUNTER — Encounter: Payer: 59 | Admitting: Physical Therapy

## 2023-02-27 ENCOUNTER — Other Ambulatory Visit (HOSPITAL_COMMUNITY): Payer: 59

## 2023-03-03 ENCOUNTER — Encounter (HOSPITAL_COMMUNITY): Payer: Self-pay

## 2023-03-03 ENCOUNTER — Ambulatory Visit (HOSPITAL_COMMUNITY): Admit: 2023-03-03 | Payer: 59 | Admitting: Gastroenterology

## 2023-03-03 SURGERY — COLONOSCOPY WITH PROPOFOL
Anesthesia: Monitor Anesthesia Care

## 2023-03-19 NOTE — Telephone Encounter (Signed)
 Pt contacted. Pt states he is out of [place] at this time and will not be back until April. I have asked that pt give [place] a call when he is ready to schedule.

## 2023-10-15 ENCOUNTER — Telehealth (INDEPENDENT_AMBULATORY_CARE_PROVIDER_SITE_OTHER): Payer: Self-pay

## 2023-10-15 NOTE — Telephone Encounter (Signed)
 Patients wife left vm stating she would like to schedule the pt a colonoscopy, patient a filled out a questionnaire a year ago. We need an updated questionnaire, sending letter in the mail.

## 2023-10-21 ENCOUNTER — Encounter (INDEPENDENT_AMBULATORY_CARE_PROVIDER_SITE_OTHER): Payer: Self-pay | Admitting: *Deleted

## 2023-10-26 NOTE — Telephone Encounter (Signed)
 Patients wife let another vm wanting to schedule the pt a colonoscopy. I called back and spoke with the patient and let him know that we will need his questionnaire back before we can get him scheduled. Patient verbalized understanding.

## 2023-10-27 ENCOUNTER — Telehealth: Payer: Self-pay

## 2023-10-27 NOTE — Telephone Encounter (Signed)
 Who is your primary care physician: Omar Cassette NP  Reasons for the colonoscopy: Hx polyps  Have you had a colonoscopy before?  Yes 08/17/2015 Dr.Rehman  Do you have family history of colon cancer? no  Previous colonoscopy with polyps removed? no  Do you have a history colorectal cancer?   no  Are you diabetic? If yes, Type 1 or Type 2?    no  Do you have a prosthetic or mechanical heart valve? no  Do you have a pacemaker/defibrillator?   no  Have you had endocarditis/atrial fibrillation? Not answered  Have you had joint replacement within the last 12 months?  Yes partial knee 2024  Do you tend to be constipated or have to use laxatives? Not answered  Do you have any history of drugs or alchohol?  no  Do you use supplemental oxygen?  no  Have you had a stroke or heart attack within the last 6 months? no  Do you take weight loss medication?  no    Do you take any blood-thinning medications such as: (aspirin, warfarin, Plavix, Aggrenox)  no  If yes we need the name, milligram, dosage and who is prescribing doctor  Current Outpatient Medications on File Prior to Visit  Medication Sig Dispense Refill   diclofenac (VOLTAREN) 75 MG EC tablet Take 75 mg by mouth 2 (two) times daily as needed.     No current facility-administered medications on file prior to visit.    Allergies  Allergen Reactions   Hydrocodone  Rash   Oxycodone  Rash     Pharmacy: [facility]  Primary Insurance Name: [facility] [id number]  Best number where you can be reached: [phone] or [phone]

## 2023-10-28 NOTE — Telephone Encounter (Signed)
 Spoke with patient, he is asking if he will need to take an antibiotic before getting his colonoscopy due to having his knee replacement. Please advise.

## 2023-10-28 NOTE — Telephone Encounter (Signed)
 Any room Thanks

## 2023-10-29 NOTE — Telephone Encounter (Signed)
 Spoke with the patient, he stated that he already had prep and he would call me back later this afternoon to let me know what prep he has so that I can send him the correct instructions. Patient is scheduled for 12/02/2023 at 1:15pm.

## 2023-10-29 NOTE — Telephone Encounter (Signed)
 He does not have to take any antibiotics, these are not recommended after orthopedic surgery for colonoscopy purposes

## 2023-11-02 NOTE — Telephone Encounter (Signed)
 PA on [facility] for TCS: This member's plan does not currently require notification or prior-authorization through the [employer] Notification or Prior-Authorization Program.

## 2023-11-02 NOTE — Telephone Encounter (Signed)
 Spoke with patient, he stated that he already had the nulytely prep at home. Sent instructions through [employer].

## 2023-12-02 ENCOUNTER — Ambulatory Visit (HOSPITAL_COMMUNITY)
Admission: RE | Admit: 2023-12-02 | Discharge: 2023-12-02 | Disposition: A | Discharge status: Home / Self Care | Attending: Gastroenterology | Admitting: Gastroenterology

## 2023-12-02 ENCOUNTER — Encounter (HOSPITAL_COMMUNITY): Admission: RE | Disposition: A | Payer: Self-pay | Source: Home / Self Care | Attending: Gastroenterology

## 2023-12-02 ENCOUNTER — Encounter (HOSPITAL_COMMUNITY): Payer: Self-pay | Admitting: Gastroenterology

## 2023-12-02 ENCOUNTER — Other Ambulatory Visit: Payer: Self-pay

## 2023-12-02 ENCOUNTER — Ambulatory Visit (HOSPITAL_COMMUNITY): Admitting: Anesthesiology

## 2023-12-02 ENCOUNTER — Encounter (INDEPENDENT_AMBULATORY_CARE_PROVIDER_SITE_OTHER): Payer: Self-pay | Admitting: *Deleted

## 2023-12-02 DIAGNOSIS — D122 Benign neoplasm of ascending colon: Secondary | ICD-10-CM

## 2023-12-02 DIAGNOSIS — D124 Benign neoplasm of descending colon: Secondary | ICD-10-CM

## 2023-12-02 DIAGNOSIS — Z860100 Personal history of colon polyps, unspecified: Secondary | ICD-10-CM

## 2023-12-02 DIAGNOSIS — Z1211 Encounter for screening for malignant neoplasm of colon: Secondary | ICD-10-CM

## 2023-12-02 DIAGNOSIS — K5730 Diverticulosis of large intestine without perforation or abscess without bleeding: Secondary | ICD-10-CM | POA: Insufficient documentation

## 2023-12-02 DIAGNOSIS — K648 Other hemorrhoids: Secondary | ICD-10-CM | POA: Insufficient documentation

## 2023-12-02 DIAGNOSIS — Z87891 Personal history of nicotine dependence: Secondary | ICD-10-CM | POA: Insufficient documentation

## 2023-12-02 DIAGNOSIS — Z860101 Personal history of adenomatous and serrated colon polyps: Secondary | ICD-10-CM

## 2023-12-02 DIAGNOSIS — Z79899 Other long term (current) drug therapy: Secondary | ICD-10-CM | POA: Insufficient documentation

## 2023-12-02 HISTORY — PX: COLONOSCOPY: SHX5424

## 2023-12-02 LAB — HM COLONOSCOPY

## 2023-12-02 SURGERY — COLONOSCOPY
Anesthesia: General

## 2023-12-02 MED ORDER — LACTATED RINGERS IV SOLN: INTRAVENOUS | Status: DC | PRN

## 2023-12-02 MED ORDER — PROPOFOL 500 MG/50ML IV EMUL
INTRAVENOUS | Status: DC | PRN
  Administered 2023-12-02: 200 ug/kg/min via INTRAVENOUS

## 2023-12-02 MED ORDER — PROPOFOL 10 MG/ML IV BOLUS
INTRAVENOUS | Status: DC | PRN
  Administered 2023-12-02: 100 mg via INTRAVENOUS

## 2023-12-02 NOTE — Anesthesia Postprocedure Evaluation (Signed)
 Anesthesia Post Note  Patient: Jacob Braun  Procedure(s) Performed: COLONOSCOPY  Patient location during evaluation: Endoscopy Anesthesia Type: General Level of consciousness: awake and alert Pain management: pain level controlled Vital Signs Assessment: post-procedure vital signs reviewed and stable Respiratory status: spontaneous breathing, nonlabored ventilation and respiratory function stable Cardiovascular status: stable Anesthetic complications: no   There were no known notable events for this encounter.   Last Vitals:  Vitals:   12/02/23 1247 12/02/23 1250  BP: (!) 86/64 100/68  Pulse: 85 78  Resp: 11 19  Temp: 36.5 C   SpO2: 97% 97%    Last Pain:  Vitals:   12/02/23 1250  TempSrc:   PainSc: 0-No pain                 Gudelia Eugene L Declan Mier

## 2023-12-02 NOTE — H&P (Signed)
 Jacob Braun is an 63 y.o. male.   Chief Complaint: history colon polyps. HPI: 62 y/o M with PMH hyperlipidemia, subarachnoid hemorrhage and gallbladder mass, coming for history of colon polyps.  Last colonoscopy in 2017, had 1 tubular adenoma removed.  The patient denies having any complaints such as melena, hematochezia, abdominal pain or distention, change in her bowel movement consistency or frequency, no changes in weight recently.  No family history of colorectal cancer.   Past Medical History:  Diagnosis Date   Gallbladder mass    Hemorrhoids    Hyperlipidemia    Medical history non-contributory    Subarchnoid hemorrhage-brief coma     Past Surgical History:  Procedure Laterality Date   CARPAL TUNNEL RELEASE  2008 & 2009   CHEST SURGERY  1983   exploratory surgery    COLONOSCOPY  10/30/2011   Procedure: COLONOSCOPY;  Surgeon: Claudis RAYMOND Rivet, MD;  Location: [facility];  Service: Endoscopy;  Laterality: N/A;  1030   COLONOSCOPY N/A 08/17/2015   Procedure: COLONOSCOPY;  Surgeon: Claudis RAYMOND Rivet, MD;  Location: [facility];  Service: Endoscopy;  Laterality: N/A;  2:10   KNEE CARTILAGE SURGERY  2006   Right   RADIOLOGY WITH ANESTHESIA N/A 02/14/2014   Procedure: Aneurysm Coiling;  Surgeon: Gerldine Maizes, MD;  [facility] OR;  Service: Radiology;  Laterality: N/A;   ULNAR TUNNEL RELEASE  2009   Left   VARICOCELECTOMY  1992   VASECTOMY  1999    Family History  Problem Relation Age of Onset   Hypertension Mother    Diabetes Mother    COPD Mother    Heart disease Mother 65       CHF   Cancer Father    Social History:  reports that he quit smoking about 26 years ago. His smoking use included cigarettes. He has never used smokeless tobacco. He reports that he does not drink alcohol and does not use drugs.  Allergies:  Allergies  Allergen Reactions   Hydrocodone  Rash   Alpha-Gal    Oxycodone  Rash    Medications Prior to Admission  Medication Sig Dispense  Refill   diclofenac (VOLTAREN) 75 MG EC tablet Take 75 mg by mouth 2 (two) times daily as needed.      No results found for this or any previous visit (from the past 48 hours). No results found.  Review of Systems  All other systems reviewed and are negative.   Blood pressure 115/79, pulse 89, resp. rate 11, height 5' 10 (1.778 m), weight 77.1 kg, SpO2 97%. Physical Exam  GENERAL: The patient is AO x3, in no acute distress. HEENT: Head is normocephalic and atraumatic. EOMI are intact. Mouth is well hydrated and without lesions. NECK: Supple. No masses LUNGS: Clear to auscultation. No presence of rhonchi/wheezing/rales. Adequate chest expansion HEART: RRR, normal s1 and s2. ABDOMEN: Soft, nontender, no guarding, no peritoneal signs, and nondistended. BS +. No masses. EXTREMITIES: Without any cyanosis, clubbing, rash, lesions or edema. NEUROLOGIC: AOx3, no focal motor deficit. SKIN: no jaundice, no rashes  Assessment/Plan 62 y/o M with PMH hyperlipidemia, subarachnoid hemorrhage and gallbladder mass, coming for history of colon polyps.  Will proceed with colonoscopy.  Toribio Eartha Flavors, MD 12/02/2023, 12:12 PM

## 2023-12-02 NOTE — Transfer of Care (Signed)
 Immediate Anesthesia Transfer of Care Note  Patient: Jacob Braun  Procedure(s) Performed: COLONOSCOPY  Patient Location: Endoscopy Unit  Anesthesia Type:General  Level of Consciousness: awake, alert , oriented, and patient cooperative  Airway & Oxygen Therapy: Patient Spontanous Breathing  Post-op Assessment: stable  Post vital signs: Reviewed and stable  Last Vitals:  Vitals Value Taken Time  BP 100/68 12/02/23 12:50  Temp 36.5 C 12/02/23 12:47  Pulse 78 12/02/23 12:50  Resp 19 12/02/23 12:50  SpO2 97 % 12/02/23 12:50    Last Pain:  Vitals:   12/02/23 1250  TempSrc:   PainSc: 0-No pain      Patients Stated Pain Goal: 8 (12/02/23 1204)  Complications: No notable events documented.

## 2023-12-02 NOTE — Discharge Instructions (Signed)
 You are being discharged to home.  Resume your previous diet.  We are waiting for your pathology results.  Your physician has recommended a repeat colonoscopy for surveillance based on pathology results.

## 2023-12-02 NOTE — Op Note (Signed)
 [facility] Patient Name: Jacob Braun Procedure Date: 12/02/2023 12:00 PM MRN: [id number] Date of Birth: [birth year] Attending MD: Toribio Fortune , , [id number] CSN: [id number] Age: 63 Admit Type: Outpatient Procedure:                Colonoscopy Indications:              Surveillance: Personal history of adenomatous                            polyps on last colonoscopy > 5 years ago Providers:                Toribio Fortune, Madelin Hunter, RN, Rosina Sprague Referring MD:              Medicines:                Monitored Anesthesia Care Complications:            No immediate complications. Estimated Blood Loss:     Estimated blood loss: none. Procedure:                Pre-Anesthesia Assessment:                           - Prior to the procedure, a History and Physical                            was performed, and patient medications, allergies                            and sensitivities were reviewed. The patient's                            tolerance of previous anesthesia was reviewed.                           - The risks and benefits of the procedure and the                            sedation options and risks were discussed with the                            patient. All questions were answered and informed                            consent was obtained.                           - ASA Grade Assessment: II - A patient with mild                            systemic disease.                           After obtaining informed consent, the colonoscope                            was passed under  direct vision. Throughout the                            procedure, the patient's blood pressure, pulse, and                            oxygen saturations were monitored continuously. The                            [id number] ([id number]) Peds Colon was introduced                            through the anus and advanced to the the cecum,                            identified by  appendiceal orifice and ileocecal                            valve. The colonoscopy was performed without                            difficulty. The patient tolerated the procedure                            well. The quality of the bowel preparation was good. Scope In: 12:23:10 PM Scope Out: 12:43:38 PM Scope Withdrawal Time: 0 hours 14 minutes 19 seconds  Total Procedure Duration: 0 hours 20 minutes 28 seconds  Findings:      The perianal and digital rectal examinations were normal.      A 3 mm polyp was found in the ascending colon. The polyp was sessile.       The polyp was removed with a cold snare. Resection and retrieval were       complete.      A 3 mm polyp was found in the descending colon. The polyp was sessile.       The polyp was removed with a cold snare. Resection and retrieval were       complete.      Scattered small-mouthed diverticula were found in the sigmoid colon and       descending colon.      Non-bleeding internal hemorrhoids were found during retroflexion. The       hemorrhoids were medium-sized. Impression:               - One 3 mm polyp in the ascending colon, removed                            with a cold snare. Resected and retrieved.                           - One 3 mm polyp in the descending colon, removed                            with a cold snare. Resected and retrieved.                           -  Diverticulosis in the sigmoid colon and in the                            descending colon.                           - Non-bleeding internal hemorrhoids. Moderate Sedation:      Per Anesthesia Care Recommendation:           - Discharge patient to home (ambulatory).                           - Resume previous diet.                           - Await pathology results.                           - Repeat colonoscopy for surveillance based on                            pathology results. Procedure Code(s):        --- Professional ---                            [phone], Colonoscopy, flexible; with removal of                            tumor(s), polyp(s), or other lesion(s) by snare                            technique Diagnosis Code(s):        --- Professional ---                           Z86.010, Personal history of colonic polyps                           D12.2, Benign neoplasm of ascending colon                           D12.4, Benign neoplasm of descending colon                           K64.8, Other hemorrhoids                           K57.30, Diverticulosis of large intestine without                            perforation or abscess without bleeding CPT copyright 2022 American Medical Association. All rights reserved. The codes documented in this report are preliminary and upon coder review may  be revised to meet current compliance requirements. Toribio Fortune, MD Toribio Fortune,  12/02/2023 12:51:37 PM This report has been signed electronically. Number of Addenda: 0

## 2023-12-02 NOTE — Anesthesia Preprocedure Evaluation (Signed)
 Anesthesia Evaluation  Patient identified by MRN, date of birth, ID band Patient awake    Reviewed: Allergy & Precautions, H&P , NPO status , Patient's Chart, lab work & pertinent test results, reviewed documented beta blocker date and time   Airway Mallampati: II  TM Distance: >3 FB Neck ROM: full    Dental no notable dental hx. (+) Dental Advisory Given, Teeth Intact   Pulmonary neg pulmonary ROS, former smoker   Pulmonary exam normal breath sounds clear to auscultation       Cardiovascular Exercise Tolerance: Good negative cardio ROS Normal cardiovascular exam Rhythm:regular Rate:Normal     Neuro/Psych Subarachnoid hemorrhage with brief coma and aneurysm coiling in 2016  negative psych ROS   GI/Hepatic negative GI ROS, Neg liver ROS,,,  Endo/Other  negative endocrine ROS    Renal/GU negative Renal ROS  negative genitourinary   Musculoskeletal   Abdominal   Peds  Hematology negative hematology ROS (+)   Anesthesia Other Findings   Reproductive/Obstetrics negative OB ROS                              Anesthesia Physical Anesthesia Plan  ASA: 2  Anesthesia Plan: General   Post-op Pain Management: Minimal or no pain anticipated   Induction:   PONV Risk Score and Plan: Propofol infusion  Airway Management Planned: Natural Airway and Nasal Cannula  Additional Equipment: None  Intra-op Plan:   Post-operative Plan: Extubation in OR  Informed Consent: I have reviewed the patients History and Physical, chart, labs and discussed the procedure including the risks, benefits and alternatives for the proposed anesthesia with the patient or authorized representative who has indicated his/her understanding and acceptance.     Dental Advisory Given  Plan Discussed with: CRNA  Anesthesia Plan Comments:          Anesthesia Quick Evaluation

## 2023-12-03 ENCOUNTER — Ambulatory Visit (INDEPENDENT_AMBULATORY_CARE_PROVIDER_SITE_OTHER): Payer: Self-pay | Admitting: Gastroenterology

## 2023-12-03 LAB — SURGICAL PATHOLOGY

## 2023-12-04 ENCOUNTER — Encounter (HOSPITAL_COMMUNITY): Payer: Self-pay | Admitting: Gastroenterology

## 2023-12-04 NOTE — Progress Notes (Signed)
 5 yr TCS noted in recall Patient result letter mailed procedure note and pathology result faxed to PCP
